# Patient Record
Sex: Female | Born: 1955
Health system: Southern US, Community
[De-identification: ages and names within clinical notes are randomized; demographics above are authoritative.]

## PROBLEM LIST (undated history)

## (undated) DIAGNOSIS — K219 Gastro-esophageal reflux disease without esophagitis: Secondary | ICD-10-CM

## (undated) DIAGNOSIS — M1990 Unspecified osteoarthritis, unspecified site: Secondary | ICD-10-CM

## (undated) DIAGNOSIS — E079 Disorder of thyroid, unspecified: Secondary | ICD-10-CM

## (undated) DIAGNOSIS — F329 Major depressive disorder, single episode, unspecified: Secondary | ICD-10-CM

## (undated) DIAGNOSIS — E538 Deficiency of other specified B group vitamins: Secondary | ICD-10-CM

## (undated) DIAGNOSIS — Z5189 Encounter for other specified aftercare: Secondary | ICD-10-CM

## (undated) DIAGNOSIS — R011 Cardiac murmur, unspecified: Secondary | ICD-10-CM

## (undated) DIAGNOSIS — T7840XA Allergy, unspecified, initial encounter: Secondary | ICD-10-CM

## (undated) DIAGNOSIS — F32A Depression, unspecified: Secondary | ICD-10-CM

## (undated) DIAGNOSIS — F419 Anxiety disorder, unspecified: Secondary | ICD-10-CM

## (undated) HISTORY — DX: Unspecified osteoarthritis, unspecified site: M19.90

## (undated) HISTORY — DX: Disorder of thyroid, unspecified: E07.9

## (undated) HISTORY — DX: Allergy, unspecified, initial encounter: T78.40XA

## (undated) HISTORY — DX: Gastro-esophageal reflux disease without esophagitis: K21.9

## (undated) HISTORY — DX: Encounter for other specified aftercare: Z51.89

## (undated) HISTORY — DX: Depression, unspecified: F32.A

## (undated) HISTORY — DX: Cardiac murmur, unspecified: R01.1

## (undated) HISTORY — DX: Deficiency of other specified B group vitamins: E53.8

## (undated) HISTORY — PX: DILATION AND CURETTAGE OF UTERUS: SHX78

## (undated) HISTORY — DX: Anxiety disorder, unspecified: F41.9

## (undated) HISTORY — PX: ABDOMINAL HYSTERECTOMY: SHX81

## (undated) HISTORY — PX: KNEE SURGERY: SHX244

## (undated) HISTORY — DX: Major depressive disorder, single episode, unspecified: F32.9

---

## 1999-02-28 ENCOUNTER — Other Ambulatory Visit: Admission: RE | Admit: 1999-02-28 | Discharge: 1999-02-28 | Payer: Self-pay | Admitting: Family Medicine

## 2013-10-29 ENCOUNTER — Telehealth: Payer: Self-pay | Admitting: Pulmonary Disease

## 2013-10-29 NOTE — Telephone Encounter (Signed)
Called spoke with pt. I made her aware SN is not accepting any new patients.  She scheduled an appt to see MW in august. nothignf urther needed

## 2013-11-03 ENCOUNTER — Institutional Professional Consult (permissible substitution): Payer: Self-pay | Admitting: Critical Care Medicine

## 2013-12-18 ENCOUNTER — Institutional Professional Consult (permissible substitution): Payer: Self-pay | Admitting: Internal Medicine

## 2014-04-06 ENCOUNTER — Encounter: Payer: Self-pay | Admitting: Vascular Surgery

## 2014-04-06 ENCOUNTER — Other Ambulatory Visit: Payer: Self-pay | Admitting: *Deleted

## 2014-04-06 DIAGNOSIS — I83813 Varicose veins of bilateral lower extremities with pain: Secondary | ICD-10-CM

## 2014-05-20 ENCOUNTER — Encounter: Payer: Self-pay | Admitting: Vascular Surgery

## 2014-05-21 ENCOUNTER — Encounter: Payer: Self-pay | Admitting: Vascular Surgery

## 2014-05-21 ENCOUNTER — Encounter (HOSPITAL_COMMUNITY): Payer: Self-pay

## 2014-07-14 ENCOUNTER — Encounter (HOSPITAL_COMMUNITY): Payer: Self-pay

## 2014-07-14 ENCOUNTER — Encounter: Payer: Self-pay | Admitting: Surgery

## 2014-07-22 ENCOUNTER — Encounter (HOSPITAL_COMMUNITY): Payer: Self-pay

## 2014-07-22 ENCOUNTER — Encounter: Payer: Self-pay | Admitting: Vascular Surgery

## 2020-01-12 ENCOUNTER — Ambulatory Visit: Payer: Self-pay | Admitting: Family

## 2020-02-22 ENCOUNTER — Ambulatory Visit: Payer: Self-pay | Admitting: Family

## 2020-04-22 ENCOUNTER — Other Ambulatory Visit: Payer: Self-pay

## 2020-04-22 ENCOUNTER — Ambulatory Visit
Admission: EM | Admit: 2020-04-22 | Discharge: 2020-04-22 | Disposition: A | Discharge status: Home / Self Care | Payer: PPO | Attending: Family Medicine | Admitting: Family Medicine

## 2020-04-22 DIAGNOSIS — B349 Viral infection, unspecified: Secondary | ICD-10-CM

## 2020-04-22 DIAGNOSIS — Z7689 Persons encountering health services in other specified circumstances: Secondary | ICD-10-CM | POA: Diagnosis not present

## 2020-04-22 MED ORDER — BENZONATATE 100 MG PO CAPS: 100.0000 mg | ORAL_CAPSULE | Freq: Three times a day (TID) | ORAL | 0 refills | Status: DC

## 2020-04-22 NOTE — Discharge Instructions (Addendum)
Concern for some sort of viral illness like Covid versus allergies. You can continue the Mucinex D. Recommend doing Flonase and Zyrtec over-the-counter. Your Covid test is pending Follow up as needed for continued or worsening symptoms

## 2020-04-22 NOTE — ED Provider Notes (Signed)
[facility]    CSN: [id number] Arrival date & time: 04/22/20  1256      History   Chief Complaint Chief Complaint  Patient presents with  . Nasal Congestion  . Cough    HPI Theresa Nichols is a 64 y.o. female.   Patient is a 64 year old female who presents today for cough, nasal congestion started Wednesday.  Symptoms have been constant.  Taking Mucinex with some relief.  She is also had some mild body aches and chills.  No chest pain or shortness of breath     Past Medical History:  Diagnosis Date  . Anxiety   . Depression   . Thyroid disease    hypothyroidism  . Vitamin B12 deficiency     There are no problems to display for this patient.   Past Surgical History:  Procedure Laterality Date  . ABDOMINAL HYSTERECTOMY    . KNEE SURGERY      OB History   No obstetric history on file.      Home Medications    Prior to Admission medications   Medication Sig Start Date End Date Taking? Authorizing Provider  benzonatate (TESSALON) 100 MG capsule Take 1 capsule (100 mg total) by mouth every 8 (eight) hours. 1 to 2 capsules every 8 hours as needed 04/22/20  Yes Solace Manwarren A, NP  Biotin 1 MG CAPS Take 1,000 mg by mouth daily.    [provider]  buPROPion (WELLBUTRIN XL) 150 MG 24 hr tablet Take 150 mg by mouth daily.    [provider]  cholecalciferol (VITAMIN D) 1000 UNITS tablet Take 1,000 Units by mouth daily.    [provider]  clorazepate (TRANXENE) 7.5 MG tablet Take 7.5 mg by mouth 2 (two) times daily as needed for anxiety.    [provider]  Efinaconazole (JUBLIA) 10 % SOLN Apply 10 % topically daily.    [provider]  fluocinonide cream (LIDEX) 0.05 % Apply 1 application topically 2 (two) times daily.    [provider]  levothyroxine (SYNTHROID, LEVOTHROID) 75 MCG tablet Take 75 mcg by mouth daily before breakfast.    [provider]  meloxicam (MOBIC) 15 MG tablet Take  15 mg by mouth daily.    [provider]  Multiple Vitamins-Minerals (CORAL CALCIUM PLUS PO) Take 1,000 mg by mouth daily.    [provider]  Multiple Vitamins-Minerals (MULTIVITAMIN & MINERAL PO) Take by mouth daily.    [provider]  RANITIDINE HCL PO Take 150 mg by mouth 2 (two) times daily.    [provider]  valACYclovir (VALTREX) 1000 MG tablet Take 1,000 mg by mouth 2 (two) times daily.    [provider]    Family History Family History  Family history unknown: Yes    Social History Social History   Tobacco Use  . Smoking status: Former Games developer  . Smokeless tobacco: Never Used  Substance Use Topics  . Alcohol use: Not Currently  . Drug use: Never     Allergies   Patient has no known allergies.   Review of Systems Review of Systems   Physical Exam Triage Vital Signs ED Triage Vitals [04/22/20 1328]  Enc Vitals Group     BP (!) 143/82     Pulse Rate 75     Resp 18     Temp 98.6 F (37 C)     Temp src      SpO2 98 %  Weight      Height      Head Circumference      Peak Flow      Pain Score      Pain Loc      Pain Edu?      Excl. in [place]?    No data found.  Updated Vital Signs BP (!) 143/82   Pulse 75   Temp 98.6 F (37 C)   Resp 18   SpO2 98%   Visual Acuity Right Eye Distance:   Left Eye Distance:   Bilateral Distance:    Right Eye Near:   Left Eye Near:    Bilateral Near:     Physical Exam Vitals and nursing note reviewed.  Constitutional:      General: She is not in acute distress.    Appearance: Normal appearance. She is not ill-appearing, toxic-appearing or diaphoretic.  HENT:     Head: Normocephalic.     Right Ear: Tympanic membrane and ear canal normal.     Left Ear: Tympanic membrane and ear canal normal.     Nose: Congestion present.     Mouth/Throat:     Pharynx: Oropharynx is clear.  Eyes:     Conjunctiva/sclera: Conjunctivae normal.  Cardiovascular:     Rate and  Rhythm: Normal rate and regular rhythm.  Pulmonary:     Effort: Pulmonary effort is normal.     Breath sounds: Normal breath sounds.  Musculoskeletal:        General: Normal range of motion.     Cervical back: Normal range of motion.  Skin:    General: Skin is warm and dry.     Findings: No rash.  Neurological:     Mental Status: She is alert.  Psychiatric:        Mood and Affect: Mood normal.      UC Treatments / Results  Labs (all labs ordered are listed, but only abnormal results are displayed) Labs Reviewed  COVID-19, FLU A+B NAA    EKG   Radiology No results found.  Procedures Procedures (including critical care time)  Medications Ordered in UC Medications - No data to display  Initial Impression / Assessment and Plan / UC Course  I have reviewed the triage vital signs and the nursing notes.  Pertinent labs & imaging results that were available during my care of the patient were reviewed by me and considered in my medical decision making (see chart for details).     Viral illness Concern for Covid based on symptoms.  Covid swab pending.  Mucinex D as needed.  Zyrtec and Flonase over-the-counter.  Tessalon Perles for cough. Follow up as needed for continued or worsening symptoms  Final Clinical Impressions(s) / UC Diagnoses   Final diagnoses:  Viral illness     Discharge Instructions     Concern for some sort of viral illness like Covid versus allergies. You can continue the Mucinex D. Recommend doing Flonase and Zyrtec over-the-counter. Your Covid test is pending Follow up as needed for continued or worsening symptoms     ED Prescriptions    Medication Sig Dispense Auth. Provider   benzonatate (TESSALON) 100 MG capsule Take 1 capsule (100 mg total) by mouth every 8 (eight) hours. 1 to 2 capsules every 8 hours as needed 45 capsule Eiden Bagot A, NP     PDMP not reviewed this encounter.   Orvan July, NP 04/22/20 1425

## 2020-04-22 NOTE — ED Triage Notes (Signed)
Pt presents with cough and nasal congestion that began on wednesday

## 2020-04-27 LAB — COVID-19, FLU A+B NAA
Influenza A, NAA: NOT DETECTED
Influenza B, NAA: NOT DETECTED
SARS-CoV-2, NAA: DETECTED — AB

## 2020-05-18 ENCOUNTER — Encounter: Payer: Self-pay | Admitting: Family

## 2020-05-18 ENCOUNTER — Other Ambulatory Visit: Payer: Self-pay

## 2020-05-18 ENCOUNTER — Other Ambulatory Visit: Payer: Self-pay | Admitting: Family

## 2020-05-18 ENCOUNTER — Ambulatory Visit (INDEPENDENT_AMBULATORY_CARE_PROVIDER_SITE_OTHER): Payer: PPO | Admitting: Family

## 2020-05-18 VITALS — BP 140/82 | HR 87 | Temp 98.3°F | Ht 62.5 in | Wt 159.0 lb

## 2020-05-18 DIAGNOSIS — L989 Disorder of the skin and subcutaneous tissue, unspecified: Secondary | ICD-10-CM | POA: Diagnosis not present

## 2020-05-18 DIAGNOSIS — E559 Vitamin D deficiency, unspecified: Secondary | ICD-10-CM | POA: Diagnosis not present

## 2020-05-18 DIAGNOSIS — E039 Hypothyroidism, unspecified: Secondary | ICD-10-CM

## 2020-05-18 DIAGNOSIS — Z13220 Encounter for screening for lipoid disorders: Secondary | ICD-10-CM

## 2020-05-18 DIAGNOSIS — R5383 Other fatigue: Secondary | ICD-10-CM

## 2020-05-18 DIAGNOSIS — Z1231 Encounter for screening mammogram for malignant neoplasm of breast: Secondary | ICD-10-CM | POA: Diagnosis not present

## 2020-05-18 DIAGNOSIS — E538 Deficiency of other specified B group vitamins: Secondary | ICD-10-CM | POA: Diagnosis not present

## 2020-05-18 LAB — CBC WITH DIFFERENTIAL/PLATELET
Basophils Absolute: 0 10*3/uL (ref 0.0–0.1)
Basophils Relative: 0.7 % (ref 0.0–3.0)
Eosinophils Absolute: 0.2 10*3/uL (ref 0.0–0.7)
Eosinophils Relative: 2.9 % (ref 0.0–5.0)
HCT: 40.5 % (ref 36.0–46.0)
Hemoglobin: 13.5 g/dL (ref 12.0–15.0)
Lymphocytes Relative: 29.1 % (ref 12.0–46.0)
Lymphs Abs: 1.6 10*3/uL (ref 0.7–4.0)
MCHC: 33.4 g/dL (ref 30.0–36.0)
MCV: 89 fl (ref 78.0–100.0)
Monocytes Absolute: 0.5 10*3/uL (ref 0.1–1.0)
Monocytes Relative: 9 % (ref 3.0–12.0)
Neutro Abs: 3.2 10*3/uL (ref 1.4–7.7)
Neutrophils Relative %: 58.3 % (ref 43.0–77.0)
Platelets: 341 10*3/uL (ref 150.0–400.0)
RBC: 4.55 Mil/uL (ref 3.87–5.11)
RDW: 14.4 % (ref 11.5–15.5)
WBC: 5.4 10*3/uL (ref 4.0–10.5)

## 2020-05-18 LAB — VITAMIN D 25 HYDROXY (VIT D DEFICIENCY, FRACTURES): VITD: 25.82 ng/mL — ABNORMAL LOW (ref 30.00–100.00)

## 2020-05-18 LAB — LIPID PANEL
Cholesterol: 237 mg/dL — ABNORMAL HIGH (ref 0–200)
HDL: 61.8 mg/dL (ref >39.00)
LDL Cholesterol: 158 mg/dL — ABNORMAL HIGH (ref 0–99)
NonHDL: 174.76
Total CHOL/HDL Ratio: 4
Triglycerides: 83 mg/dL (ref 0.0–149.0)
VLDL: 16.6 mg/dL (ref 0.0–40.0)

## 2020-05-18 LAB — COMPREHENSIVE METABOLIC PANEL
ALT: 15 U/L (ref 0–35)
AST: 20 U/L (ref 0–37)
Albumin: 4.1 g/dL (ref 3.5–5.2)
Alkaline Phosphatase: 74 U/L (ref 39–117)
BUN: 17 mg/dL (ref 6–23)
CO2: 29 mEq/L (ref 19–32)
Calcium: 9.6 mg/dL (ref 8.4–10.5)
Chloride: 104 mEq/L (ref 96–112)
Creatinine, Ser: 0.85 mg/dL (ref 0.40–1.20)
GFR: 73 mL/min (ref >60.00)
Glucose, Bld: 85 mg/dL (ref 70–99)
Potassium: 4 mEq/L (ref 3.5–5.1)
Sodium: 139 mEq/L (ref 135–145)
Total Bilirubin: 0.9 mg/dL (ref 0.2–1.2)
Total Protein: 6.8 g/dL (ref 6.0–8.3)

## 2020-05-18 LAB — TSH: TSH: 7.42 u[IU]/mL — ABNORMAL HIGH (ref 0.35–4.50)

## 2020-05-18 LAB — VITAMIN B12: Vitamin B-12: 445 pg/mL (ref 211–911)

## 2020-05-18 MED ORDER — LEVOTHYROXINE SODIUM 75 MCG PO TABS: 75.0000 ug | ORAL_TABLET | Freq: Every day | ORAL | 1 refills | Status: DC

## 2020-05-18 MED ORDER — VALACYCLOVIR HCL 1 G PO TABS: 1000.0000 mg | ORAL_TABLET | Freq: Two times a day (BID) | ORAL | 1 refills | Status: DC

## 2020-05-18 MED ORDER — DICLOFENAC SODIUM 1 % EX GEL: 4.0000 g | Freq: Four times a day (QID) | CUTANEOUS | 0 refills | Status: DC

## 2020-05-18 NOTE — Progress Notes (Signed)
Theresa Nichols is a 65 y.o. female with the following history as recorded in EpicCare:  There are no problems to display for this patient.   Current Outpatient Medications  Medication Sig Dispense Refill  . Biotin 1 MG CAPS Take 1,000 mg by mouth daily.    . cholecalciferol (VITAMIN D) 1000 UNITS tablet Take 1,000 Units by mouth daily.    . diclofenac Sodium (VOLTAREN) 1 % GEL Apply 4 g topically 4 (four) times daily. 150 g 0  . fluocinonide cream (LIDEX) 1.61 % Apply 1 application topically 2 (two) times daily.    Marland Kitchen levothyroxine (SYNTHROID, LEVOTHROID) 75 MCG tablet Take 75 mcg by mouth daily before breakfast.    . Multiple Vitamins-Minerals (MULTIVITAMIN & MINERAL PO) Take by mouth daily.    . valACYclovir (VALTREX) 1000 MG tablet Take 1 tablet (1,000 mg total) by mouth 2 (two) times daily. 30 tablet 1   No current facility-administered medications for this visit.    Allergies: Patient has no known allergies.  Past Medical History:  Diagnosis Date  . Anxiety   . Depression   . Thyroid disease    hypothyroidism  . Vitamin B12 deficiency     Past Surgical History:  Procedure Laterality Date  . ABDOMINAL HYSTERECTOMY    . DILATION AND CURETTAGE OF UTERUS    . KNEE SURGERY      Family History  Family history unknown: Yes    Social History   Tobacco Use  . Smoking status: Former Research scientist (life sciences)  . Smokeless tobacco: Never Used  Substance Use Topics  . Alcohol use: Not Currently    Subjective:  Patient presents today as a new patient; accompanied by her husband; Notes that she had a "bad fall" in 2019- asking to see a neurologist due to persisting headaches; wants to see provider at [facility];  Also complaining of chronic right knee pain- wants to see provider at [facility];    Needs to get labs updated; has history of hypothyroidism- admits she is not taking her medication regularly; has not had insurance for past few years;     Objective:  Vitals:   05/18/20 0909  BP: 140/82   Pulse: 87  Temp: 98.3 F (36.8 C)  TempSrc: Oral  SpO2: 98%  Weight: 159 lb (72.1 kg)  Height: 5' 2.5" (1.588 m)    General: Well developed, well nourished, in no acute distress  Skin : Warm and dry.  Head: Normocephalic and atraumatic  Eyes: Sclera and conjunctiva clear; pupils round and reactive to light; extraocular movements intact  Ears: External normal; canals clear; tympanic membranes normal  Oropharynx: Pink, supple. No suspicious lesions  Neck: Supple without thyromegaly, adenopathy  Lungs: Respirations unlabored; clear to auscultation bilaterally without wheeze, rales, rhonchi  CVS exam: normal rate and regular rhythm.  Neurologic: Alert and oriented; speech intact; face symmetrical; moves all extremities well; CNII-XII intact without focal deficit   Assessment:  1. Screening mammogram for breast cancer   2. Skin abnormalities   3. Other fatigue   4. Lipid screening   5. Hypothyroidism, unspecified type   6. Vitamin D deficiency   7. Low vitamin B12 level     Plan:  1. Referral to [facility] for mammogram; 2. Refer to dermatology per patient request; Update labs today- stressed need to take her thyroid medication daily as prescribed.  Patient wants to call back with names of providers she wants to see at [facility] for neurology and orthopedics;  She also mentions that she  wants as much of her care done in [place] as possible and will consider seeing [facility] in [place] as this is closer to her home.  Follow up to be determined;   No follow-ups on file.  Orders Placed This Encounter  Procedures  . MM Digital Screening    Standing Status:   Future    Standing Expiration Date:   05/18/2021    Order Specific Question:   Reason for Exam (SYMPTOM  OR DIAGNOSIS REQUIRED)    Answer:   screening mammogram    Order Specific Question:   Preferred imaging location?    Answer:   [facility]  . CBC with Differential/Platelet    Standing Status:    Future    Number of Occurrences:   1    Standing Expiration Date:   05/18/2021  . Comp Met (CMET)    Standing Status:   Future    Number of Occurrences:   1    Standing Expiration Date:   05/18/2021  . Lipid panel    Standing Status:   Future    Number of Occurrences:   1    Standing Expiration Date:   05/18/2021  . TSH    Standing Status:   Future    Number of Occurrences:   1    Standing Expiration Date:   05/18/2021  . Vitamin B12    Standing Status:   Future    Number of Occurrences:   1    Standing Expiration Date:   05/18/2021  . Vitamin D (25 hydroxy)    Standing Status:   Future    Number of Occurrences:   1    Standing Expiration Date:   05/18/2021  . Thyroid Panel With TSH  . Ambulatory referral to Dermatology    Referral Priority:   Routine    Referral Type:   Consultation    Referral Reason:   Specialty Services Required    Requested Specialty:   Dermatology    Number of Visits Requested:   1    Requested Prescriptions   Signed Prescriptions Disp Refills  . valACYclovir (VALTREX) 1000 MG tablet 30 tablet 1    Sig: Take 1 tablet (1,000 mg total) by mouth 2 (two) times daily.  . diclofenac Sodium (VOLTAREN) 1 % GEL 150 g 0    Sig: Apply 4 g topically 4 (four) times daily.

## 2020-05-19 LAB — THYROID PANEL WITH TSH
Free Thyroxine Index: 2.2 (ref 1.4–3.8)
T3 Uptake: 28 % (ref 22–35)
T4, Total: 8 ug/dL (ref 5.1–11.9)
TSH: 6.66 mIU/L — ABNORMAL HIGH (ref 0.40–4.50)

## 2020-05-30 ENCOUNTER — Ambulatory Visit (HOSPITAL_COMMUNITY): Payer: PPO

## 2020-05-31 ENCOUNTER — Encounter: Payer: Self-pay | Admitting: Family

## 2020-06-01 ENCOUNTER — Other Ambulatory Visit: Payer: Self-pay | Admitting: Family

## 2020-06-01 DIAGNOSIS — E039 Hypothyroidism, unspecified: Secondary | ICD-10-CM

## 2020-06-01 DIAGNOSIS — Z87828 Personal history of other (healed) physical injury and trauma: Secondary | ICD-10-CM

## 2020-06-06 ENCOUNTER — Telehealth: Payer: Self-pay | Admitting: Family

## 2020-06-06 DIAGNOSIS — E2839 Other primary ovarian failure: Secondary | ICD-10-CM

## 2020-06-06 NOTE — Telephone Encounter (Signed)
I am not sure if she has responded to the message from [facility];  They have been trying to reach her to let her know that she has an appointment there on 2/17 at 10 am with provider, Schuyler Amor;  She needs to call them at [phone] with continued concerns or to re-schedule.

## 2020-06-07 NOTE — Telephone Encounter (Signed)
LVM re: laura's note to contact [facility] if needed to reschedule appt.

## 2020-06-08 ENCOUNTER — Other Ambulatory Visit: Payer: Self-pay

## 2020-06-08 ENCOUNTER — Ambulatory Visit (HOSPITAL_COMMUNITY)
Admission: RE | Admit: 2020-06-08 | Discharge: 2020-06-08 | Disposition: A | Discharge status: Home / Self Care | Payer: PPO | Source: Ambulatory Visit | Attending: Family | Admitting: Family

## 2020-06-08 ENCOUNTER — Encounter (HOSPITAL_COMMUNITY): Payer: Self-pay

## 2020-06-08 DIAGNOSIS — Z1231 Encounter for screening mammogram for malignant neoplasm of breast: Secondary | ICD-10-CM | POA: Diagnosis not present

## 2020-06-08 NOTE — Telephone Encounter (Signed)
Pt states she has confirmed appt tomorrow with Donnetta Simpers.  She would like to know if a bone density test is recommended.

## 2020-06-10 DIAGNOSIS — D1801 Hemangioma of skin and subcutaneous tissue: Secondary | ICD-10-CM | POA: Diagnosis not present

## 2020-06-10 DIAGNOSIS — L820 Inflamed seborrheic keratosis: Secondary | ICD-10-CM | POA: Diagnosis not present

## 2020-06-10 NOTE — Telephone Encounter (Signed)
When was the last time she had a bone density? She didn't bring any records to review at her first OV. Has her orthopedist ordered for her- it looks like she has been working with someone in [place].

## 2020-06-15 ENCOUNTER — Encounter: Payer: Self-pay | Admitting: Family

## 2020-06-17 ENCOUNTER — Other Ambulatory Visit: Payer: Self-pay | Admitting: Family

## 2020-06-17 DIAGNOSIS — E559 Vitamin D deficiency, unspecified: Secondary | ICD-10-CM

## 2020-06-17 NOTE — Telephone Encounter (Signed)
LVM instructing pt to call back for Mickel Baas response.  Will send mychart message

## 2020-06-21 NOTE — Addendum Note (Signed)
Addended by: Sherlene Shams on: 06/21/2020 12:39 PM   Modules accepted: Orders

## 2020-06-21 NOTE — Telephone Encounter (Signed)
DEXA has been ordered at [facility] for her;

## 2020-06-22 NOTE — Telephone Encounter (Signed)
Appt scheduled at [facility] on 06/29/20. Pt aware.

## 2020-06-29 ENCOUNTER — Other Ambulatory Visit (INDEPENDENT_AMBULATORY_CARE_PROVIDER_SITE_OTHER): Payer: PPO

## 2020-06-29 ENCOUNTER — Other Ambulatory Visit: Payer: Self-pay

## 2020-06-29 ENCOUNTER — Ambulatory Visit (INDEPENDENT_AMBULATORY_CARE_PROVIDER_SITE_OTHER)
Admission: RE | Admit: 2020-06-29 | Discharge: 2020-06-29 | Disposition: A | Discharge status: Home / Self Care | Payer: PPO | Source: Ambulatory Visit | Attending: Family | Admitting: Family

## 2020-06-29 DIAGNOSIS — E2839 Other primary ovarian failure: Secondary | ICD-10-CM | POA: Diagnosis not present

## 2020-06-29 DIAGNOSIS — E559 Vitamin D deficiency, unspecified: Secondary | ICD-10-CM

## 2020-06-29 DIAGNOSIS — E039 Hypothyroidism, unspecified: Secondary | ICD-10-CM | POA: Diagnosis not present

## 2020-06-29 LAB — VITAMIN D 25 HYDROXY (VIT D DEFICIENCY, FRACTURES): VITD: 33.02 ng/mL (ref 30.00–100.00)

## 2020-06-30 ENCOUNTER — Other Ambulatory Visit: Payer: Self-pay | Admitting: Family

## 2020-06-30 LAB — THYROID PANEL WITH TSH
Free Thyroxine Index: 2.9 (ref 1.4–3.8)
T3 Uptake: 28 % (ref 22–35)
T4, Total: 10.5 ug/dL (ref 5.1–11.9)
TSH: 1.69 mIU/L (ref 0.40–4.50)

## 2020-06-30 MED ORDER — LEVOTHYROXINE SODIUM 75 MCG PO TABS: 75.0000 ug | ORAL_TABLET | Freq: Every day | ORAL | 2 refills | Status: DC

## 2020-07-16 ENCOUNTER — Other Ambulatory Visit: Payer: Self-pay | Admitting: Family

## 2020-08-05 ENCOUNTER — Ambulatory Visit: Payer: PPO | Admitting: Neurology

## 2020-08-22 ENCOUNTER — Encounter: Payer: Self-pay | Admitting: Family

## 2020-08-22 ENCOUNTER — Other Ambulatory Visit: Payer: Self-pay | Admitting: Family

## 2020-08-22 MED ORDER — CETIRIZINE HCL 10 MG PO TABS: 10.0000 mg | ORAL_TABLET | Freq: Every day | ORAL | 2 refills | Status: DC

## 2020-08-22 MED ORDER — FLUTICASONE PROPIONATE 50 MCG/ACT NA SUSP: 2.0000 | Freq: Every day | NASAL | 2 refills | Status: DC

## 2020-08-22 NOTE — Telephone Encounter (Signed)
I have attempted to call pt to ask her if she was going to continue care with [place] at [facility] or go to [place]. There was no answer and the mailbox was full.   Also please advise on med request.

## 2020-09-27 ENCOUNTER — Ambulatory Visit (INDEPENDENT_AMBULATORY_CARE_PROVIDER_SITE_OTHER): Payer: PPO | Admitting: Neurology

## 2020-09-27 ENCOUNTER — Encounter: Payer: Self-pay | Admitting: Neurology

## 2020-09-27 DIAGNOSIS — G43019 Migraine without aura, intractable, without status migrainosus: Secondary | ICD-10-CM | POA: Diagnosis not present

## 2020-09-27 DIAGNOSIS — Z87820 Personal history of traumatic brain injury: Secondary | ICD-10-CM | POA: Diagnosis not present

## 2020-09-27 HISTORY — DX: Migraine without aura, intractable, without status migrainosus: G43.019

## 2020-09-27 HISTORY — DX: Personal history of traumatic brain injury: Z87.820

## 2020-09-27 MED ORDER — TOPIRAMATE 25 MG PO TABS: ORAL_TABLET | ORAL | 3 refills | Status: DC

## 2020-09-27 MED ORDER — RIZATRIPTAN BENZOATE 10 MG PO TABS: 10.0000 mg | ORAL_TABLET | Freq: Three times a day (TID) | ORAL | 3 refills | Status: DC | PRN

## 2020-09-27 NOTE — Progress Notes (Signed)
Reason for visit: Headache  Referring physician: Dr. Adelene Idler is a 65 y.o. female  History of present illness:  Theresa Nichols is a 65 year old left-handed white female with a history of frequent headache.  The patient indicates that she did not have any problems with headaches until she fell going out to her car after work in March 2019.  The patient slipped on gravel, she fell and hit the left frontotemporal area of her head.  She is not clear whether or not she lost consciousness, but she fractured a tooth with the fall.  Since that time, she has had very frequent headaches.  The headaches have been associated with photophobia and phonophobia, she has had some dizziness and vertigo and difficulty with balance.  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  She also injured her right shoulder and right knee that required surgery as well.  The patient has had some improvement in her headaches as time has gone on, but she still has 15-20 headache days a month.  The headaches are in the right frontotemporal area, when they are severe they are associated with photophobia and phonophobia and some nausea and vomiting.  The patient may have some cognitive changes and word finding problems.  Bright lights will bring on a headache.  She takes Tylenol and ibuprofen for the headache and sometimes uses ice and gets into a dark room.  The headache may last all day long.  She has never been on any daily prophylactic medication.  The patient did have a CT scan of the brain done at [facility].  The report of the study is not available to me.  The patient also reports icepick pains in the right frontotemporal area that may last 30 seconds to 2 minutes independent of her migraine headache.  She is sent to this office for further evaluation.  Past Medical History:  Diagnosis Date  . Anxiety   . Depression   . Thyroid disease    hypothyroidism  . Vitamin B12  deficiency     Past Surgical History:  Procedure Laterality Date  . ABDOMINAL HYSTERECTOMY    . DILATION AND CURETTAGE OF UTERUS    . KNEE SURGERY      Family History  Problem Relation Age of Onset  . Breast cancer Cousin     Social history:  reports that she has quit smoking. She has never used smokeless tobacco. She reports previous alcohol use. She reports that she does not use drugs.  Medications:  Prior to Admission medications   Medication Sig Start Date End Date Taking? Authorizing Provider  Biotin 1 MG CAPS Take 1,000 mg by mouth daily.   Yes [provider]  cetirizine (ZYRTEC) 10 MG tablet Take 1 tablet (10 mg total) by mouth daily. 08/22/20  Yes Marrian Salvage, FNP  cholecalciferol (VITAMIN D) 1000 UNITS tablet Take 1,000 Units by mouth daily.   Yes [provider]  diclofenac Sodium (VOLTAREN) 1 % GEL APPLY 4 GRAMS TOPICALLY 4 TIMES DAILY AS DIRECTED 07/18/20  Yes Marrian Salvage, FNP  fluocinonide cream (LIDEX) 0.62 % Apply 1 application topically 2 (two) times daily.   Yes [provider]  fluticasone (FLONASE) 50 MCG/ACT nasal spray Place 2 sprays into both nostrils daily. 08/22/20  Yes Marrian Salvage, FNP  levothyroxine (SYNTHROID) 75 MCG tablet Take 1 tablet (75 mcg total) by mouth daily before breakfast. 06/30/20  Yes Marrian Salvage, FNP  Multiple Vitamins-Minerals (MULTIVITAMIN & MINERAL PO) Take by mouth daily.   Yes [provider]  valACYclovir (VALTREX) 1000 MG tablet Take 1 tablet (1,000 mg total) by mouth 2 (two) times daily. 05/18/20  Yes Marrian Salvage, FNP     No Known Allergies  ROS:  Out of a complete 14 system review of symptoms, the patient complains only of the following symptoms, and all other reviewed systems are negative.  Headache Dizziness Joint pain  Blood pressure 134/82, pulse 72, height 5\' 2"  (1.575 m), weight 159 lb 9.6 oz (72.4 kg).  Physical Exam  General: The  patient is alert and cooperative at the time of the examination.  Eyes: Pupils are equal, round, and reactive to light. Discs are flat bilaterally.  Neck: The neck is supple, no carotid bruits are noted.  Respiratory: The respiratory examination is clear.  Cardiovascular: The cardiovascular examination reveals a regular rate and rhythm, no obvious murmurs or rubs are noted.  Skin: Extremities are without significant edema.  Neurologic Exam  Mental status: The patient is alert and oriented x 3 at the time of the examination. The patient has apparent normal recent and remote memory, with an apparently normal attention span and concentration ability.  Cranial nerves: Facial symmetry is present. There is good sensation of the face to pinprick and soft touch bilaterally. The strength of the facial muscles and the muscles to head turning and shoulder shrug are normal bilaterally. Speech is well enunciated, no aphasia or dysarthria is noted. Extraocular movements are full. Visual fields are full. The tongue is midline, and the patient has symmetric elevation of the soft palate. No obvious hearing deficits are noted.  Motor: The motor testing reveals 5 over 5 strength of all 4 extremities. Good symmetric motor tone is noted throughout.  Sensory: Sensory testing is intact to pinprick, soft touch, vibration sensation, and position sense on all 4 extremities. No evidence of extinction is noted.  Coordination: Cerebellar testing reveals good finger-nose-finger and heel-to-shin bilaterally.  Gait and station: Gait is associated with a limping gait on the right leg. Tandem gait is slightly unsteady. Romberg is negative. No drift is seen.  Reflexes: Deep tendon reflexes are symmetric and normal bilaterally. Toes are downgoing bilaterally.   Assessment/Plan:  1.  History of concussion  2.  Trauma triggered migraine  The patient is having frequent headaches that are consistent with migraine type  events at this point.  The patient will be started on Topamax working up to 75 mg daily, she will call for any dose adjustments.  The patient will be given Maxalt to take if needed for the headache.  She will follow-up here in 4 months.  We will try to get the report of the CT scan of the brain done previously at [place] [facility] 09/27/2020 10:15 AM  [facility] [address] [place], [place] [address]  Phone [phone] Fax [phone]

## 2020-09-27 NOTE — Patient Instructions (Signed)
We will start Topamax for headache prevention. Take Maxalt if needed for the headache.  Topamax (topiramate) is a seizure medication that has an FDA approval for seizures and for migraine headache. Potential side effects of this medication include weight loss, cognitive slowing, tingling in the fingers and toes, and carbonated drinks will taste bad. If any significant side effects are noted on this drug, please contact our office.

## 2020-09-29 ENCOUNTER — Telehealth: Payer: Self-pay | Admitting: Neurology

## 2020-09-29 NOTE — Telephone Encounter (Signed)
I have the report of the CT scan of the brain done at [facility] on 28 June 2017.  This is following a fall.  CT of the head and cervical spine were done.   CT head and cervical 06/28/17:  Impression:  1.  Negative head CT.  No intracranial mass, hemorrhage, or edema.  No skull fracture. 2.  No facial bone fracture or dislocation. 3.  No fracture or acute subluxation within the cervical spine.  Mild degenerative changes within the lower cervical spine.   CT of the head did note some calcified atherosclerotic changes of the large vessels at the base of the skull.  No abnormalities of the brain parenchyma was seen.

## 2020-11-30 ENCOUNTER — Encounter: Payer: Self-pay | Admitting: Family

## 2020-11-30 NOTE — Telephone Encounter (Signed)
Please advise on med refill request until she is seen by Dr. Ronnald Ramp.

## 2020-12-02 NOTE — Telephone Encounter (Signed)
I have called pt and there was no answer so I left a message to call back.

## 2020-12-06 NOTE — Telephone Encounter (Signed)
Attempted to call pt once again no answer.

## 2021-02-08 ENCOUNTER — Ambulatory Visit: Payer: PPO | Admitting: Adult Health

## 2021-03-01 ENCOUNTER — Other Ambulatory Visit: Payer: Self-pay | Admitting: Family

## 2021-03-15 ENCOUNTER — Other Ambulatory Visit: Payer: Self-pay

## 2021-03-15 ENCOUNTER — Encounter: Payer: Self-pay | Admitting: Internal Medicine

## 2021-03-15 ENCOUNTER — Ambulatory Visit (INDEPENDENT_AMBULATORY_CARE_PROVIDER_SITE_OTHER): Payer: 59 | Admitting: Internal Medicine

## 2021-03-15 VITALS — BP 126/84 | HR 74 | Temp 97.7°F | Ht 62.0 in | Wt 162.0 lb

## 2021-03-15 DIAGNOSIS — M7918 Myalgia, other site: Secondary | ICD-10-CM

## 2021-03-15 DIAGNOSIS — E039 Hypothyroidism, unspecified: Secondary | ICD-10-CM

## 2021-03-15 DIAGNOSIS — Z0001 Encounter for general adult medical examination with abnormal findings: Secondary | ICD-10-CM | POA: Diagnosis not present

## 2021-03-15 DIAGNOSIS — G2581 Restless legs syndrome: Secondary | ICD-10-CM | POA: Insufficient documentation

## 2021-03-15 DIAGNOSIS — I7300 Raynaud's syndrome without gangrene: Secondary | ICD-10-CM

## 2021-03-15 DIAGNOSIS — G8929 Other chronic pain: Secondary | ICD-10-CM

## 2021-03-15 DIAGNOSIS — Z1211 Encounter for screening for malignant neoplasm of colon: Secondary | ICD-10-CM

## 2021-03-15 LAB — IBC + FERRITIN
Ferritin: 22.2 ng/mL (ref 10.0–291.0)
Iron: 68 ug/dL (ref 42–145)
Saturation Ratios: 20.8 % (ref 20.0–50.0)
TIBC: 327.6 ug/dL (ref 250.0–450.0)
Transferrin: 234 mg/dL (ref 212.0–360.0)

## 2021-03-15 LAB — TSH: TSH: 3.92 u[IU]/mL (ref 0.35–5.50)

## 2021-03-15 LAB — CBC WITH DIFFERENTIAL/PLATELET
Basophils Absolute: 0.1 10*3/uL (ref 0.0–0.1)
Basophils Relative: 0.9 % (ref 0.0–3.0)
Eosinophils Absolute: 0.1 10*3/uL (ref 0.0–0.7)
Eosinophils Relative: 2.4 % (ref 0.0–5.0)
HCT: 39.8 % (ref 36.0–46.0)
Hemoglobin: 13.2 g/dL (ref 12.0–15.0)
Lymphocytes Relative: 26.9 % (ref 12.0–46.0)
Lymphs Abs: 1.6 10*3/uL (ref 0.7–4.0)
MCHC: 33.1 g/dL (ref 30.0–36.0)
MCV: 89.2 fl (ref 78.0–100.0)
Monocytes Absolute: 0.5 10*3/uL (ref 0.1–1.0)
Monocytes Relative: 8 % (ref 3.0–12.0)
Neutro Abs: 3.6 10*3/uL (ref 1.4–7.7)
Neutrophils Relative %: 61.8 % (ref 43.0–77.0)
Platelets: 331 10*3/uL (ref 150.0–400.0)
RBC: 4.46 Mil/uL (ref 3.87–5.11)
RDW: 14.6 % (ref 11.5–15.5)
WBC: 5.8 10*3/uL (ref 4.0–10.5)

## 2021-03-15 LAB — C-REACTIVE PROTEIN: CRP: <1 mg/dL (ref 0.5–20.0)

## 2021-03-15 NOTE — Patient Instructions (Signed)

## 2021-03-15 NOTE — Progress Notes (Signed)
Subjective:  Patient ID: Theresa Nichols, female    DOB: 29-Nov-1955  Age: 65 y.o. MRN: [id number]  CC: Annual Exam  This visit occurred during the SARS-CoV-2 public health emergency.  Safety protocols were in place, including screening questions prior to the visit, additional usage of staff PPE, and extensive cleaning of exam room while observing appropriate contact time as indicated for disinfecting solutions.    HPI YAZMINE SOREY presents for a CPX and to establish. She fell 3 years ago and sustained multiple injuries.  She tells me she is 90% better but continues to complain of BLE MSK pain. She wants a refill of flexeril.  She also complains of snoring, insomnia, and leg movements at night.  She tells me that no one complains about her nocturnal leg movements.  She also complains of a 1 year history of painless changes in the colors in her fingers.  She has photos of her fingers and stages of red, blue, and white.  This has been exacerbated over the last 2 months with colder weather.  She denies arthralgias or rash.  Outpatient Medications Prior to Visit  Medication Sig Dispense Refill   Biotin 1 MG CAPS Take 1,000 mg by mouth daily.     cetirizine (ZYRTEC) 10 MG tablet Take 1 tablet (10 mg total) by mouth daily. 30 tablet 2   cholecalciferol (VITAMIN D) 1000 UNITS tablet Take 1,000 Units by mouth daily.     diclofenac Sodium (VOLTAREN) 1 % GEL APPLY 4 GRAMS TOPICALLY 4 TIMES DAILY AS DIRECTED 150 g 0   fluocinonide cream (LIDEX) 4.00 % Apply 1 application topically 2 (two) times daily.     fluticasone (FLONASE) 50 MCG/ACT nasal spray Place 2 sprays into both nostrils daily. 16 g 2   levothyroxine (SYNTHROID) 75 MCG tablet Take 1 tablet (75 mcg total) by mouth daily before breakfast. 90 tablet 2   Multiple Vitamins-Minerals (MULTIVITAMIN & MINERAL PO) Take by mouth daily.     rizatriptan (MAXALT) 10 MG tablet Take 1 tablet (10 mg total) by mouth 3 (three) times daily as needed  for migraine. May repeat in 2 hours if needed 10 tablet 3   topiramate (TOPAMAX) 25 MG tablet Take one tablet at night for one week, then take 2 tablets at night for one week, then take 3 tablets at night. 90 tablet 3   valACYclovir (VALTREX) 1000 MG tablet Take 1 tablet (1,000 mg total) by mouth 2 (two) times daily. 30 tablet 1   No facility-administered medications prior to visit.    ROS Review of Systems  Constitutional:  Negative for diaphoresis and fatigue.  HENT: Negative.    Eyes: Negative.   Respiratory:  Negative for apnea, cough, chest tightness, shortness of breath and wheezing.   Cardiovascular:  Negative for chest pain, palpitations and leg swelling.  Gastrointestinal:  Negative for abdominal pain, constipation, diarrhea, nausea and vomiting.  Endocrine: Negative.   Genitourinary: Negative.  Negative for difficulty urinating.  Musculoskeletal:  Positive for myalgias. Negative for arthralgias and back pain.  Skin:  Positive for color change.  Neurological: Negative.  Negative for dizziness, weakness, light-headedness and headaches.  Hematological:  Negative for adenopathy. Does not bruise/bleed easily.  Psychiatric/Behavioral:  Positive for sleep disturbance. Negative for dysphoric mood. The patient is not nervous/anxious.    Objective:  BP 126/84 (BP Location: Left Arm, Patient Position: Sitting, Cuff Size: Large)   Pulse 74   Temp 97.7 F (36.5 C) (Oral)   Ht 5'  2" (1.575 m)   Wt 162 lb (73.5 kg)   SpO2 99%   BMI 29.63 kg/m   BP Readings from Last 3 Encounters:  03/15/21 126/84  09/27/20 134/82  05/18/20 140/82    Wt Readings from Last 3 Encounters:  03/15/21 162 lb (73.5 kg)  09/27/20 159 lb 9.6 oz (72.4 kg)  05/18/20 159 lb (72.1 kg)    Physical Exam Vitals reviewed.  Constitutional:      Appearance: Normal appearance.  HENT:     Nose: Nose normal.     Mouth/Throat:     Mouth: Mucous membranes are moist.  Eyes:     General: No scleral icterus.     Conjunctiva/sclera: Conjunctivae normal.  Cardiovascular:     Rate and Rhythm: Normal rate and regular rhythm.     Heart sounds: No murmur heard. Pulmonary:     Effort: Pulmonary effort is normal.     Breath sounds: No stridor. No wheezing, rhonchi or rales.  Abdominal:     General: Abdomen is flat.     Palpations: There is no mass.     Tenderness: There is no abdominal tenderness. There is no guarding.     Hernia: No hernia is present.  Musculoskeletal:        General: Normal range of motion.     Cervical back: Neck supple.     Right lower leg: No edema.     Left lower leg: No edema.  Lymphadenopathy:     Cervical: No cervical adenopathy.  Skin:    General: Skin is warm and dry.     Findings: No erythema, lesion or rash.  Neurological:     General: No focal deficit present.     Mental Status: She is alert.  Psychiatric:        Mood and Affect: Mood normal.        Behavior: Behavior normal.    Lab Results  Component Value Date   WBC 5.8 03/15/2021   HGB 13.2 03/15/2021   HCT 39.8 03/15/2021   PLT 331.0 03/15/2021   GLUCOSE 85 05/18/2020   CHOL 237 (H) 05/18/2020   TRIG 83.0 05/18/2020   HDL 61.80 05/18/2020   LDLCALC 158 (H) 05/18/2020   ALT 15 05/18/2020   AST 20 05/18/2020   NA 139 05/18/2020   K 4.0 05/18/2020   CL 104 05/18/2020   CREATININE 0.85 05/18/2020   BUN 17 05/18/2020   CO2 29 05/18/2020   TSH 3.92 03/15/2021    DG Bone Density  Result Date: 07/02/2020 Date of study: 06/29/20 Exam: DUAL X-RAY ABSORPTIOMETRY (DXA) FOR BONE MINERAL DENSITY (BMD) Instrument: [employer] Provider: PCP Indication: screening for osteoporosis Comparison: none (please note that it is not possible to compare data from different instruments) Clinical data: Pt is a 65 y.o. female without previous fractures. Results:  Lumbar spine L1-L4 Femoral neck (FN) 33% distal radius T-score 2.0 RFN: -0.8 LFN: -0.8 n/a Change in BMD from previous DXA test (%) n/a n/a n/a  (*) statistically significant Assessment: the BMD is normal according to the [facility] classification for osteoporosis (see below). Fracture risk: low FRAX score: not calculated due to normal BMD Comments: the technical quality of the study is good Recommend optimizing calcium (1200 mg/day) and vitamin D (800 IU/day) intake. No pharmacological treatment is indicated. Followup: Repeat BMD is appropriate after 2 years. WHO criteria for diagnosis of osteoporosis in postmenopausal women and in men 66 y/o or older: - normal: T-score -1.0 to +  1.0 - osteopenia/low bone density: T-score between -2.5 and -1.0 - osteoporosis: T-score below -2.5 - severe osteoporosis: T-score below -2.5 with history of fragility fracture Note: although not part of the WHO classification, the presence of a fragility fracture, regardless of the T-score, should be considered diagnostic of osteoporosis, provided other causes for the fracture have been excluded. Loura Pardon MD    Assessment & Plan:   Bellany was seen today for annual exam.  Diagnoses and all orders for this visit:  Hypothyroidism, unspecified type- Her TSH is in the normal range.  She will stay on the current dose of levothyroxine. -     TSH; Future -     TSH  Encounter for general adult medical examination with abnormal findings- Exam completed, labs reviewed - statin therapy is not indicated., vaccines reviewed and updated, cancer screenings addressed, patient education was given.  Screen for colon cancer -     Cologuard  Raynaud's phenomenon without gangrene- Will screen for rheumatologic disease.  I advised her to keep her hands protected from cold weather. -     ANA; Future -     C-reactive protein; Future -     Cyclic citrul peptide antibody, IgG; Future -     Cyclic citrul peptide antibody, IgG -     C-reactive protein -     ANA  RLS (restless legs syndrome)- Will screen for iron deficiency.  I have also asked her to see sleep medicine. -     CBC with  Differential/Platelet; Future -     IBC + Ferritin; Future -     IBC + Ferritin -     CBC with Differential/Platelet -     Ambulatory referral to Sleep Studies  Chronic primary musculoskeletal pain -     cyclobenzaprine (FLEXERIL) 5 MG tablet; Take 1 tablet (5 mg total) by mouth 3 (three) times daily as needed for muscle spasms.  I am having Gillermo Murdoch. Seyer start on cyclobenzaprine. I am also having her maintain her Multiple Vitamins-Minerals (MULTIVITAMIN & MINERAL PO), Biotin, cholecalciferol, fluocinonide cream, valACYclovir, levothyroxine, cetirizine, fluticasone, topiramate, rizatriptan, and diclofenac Sodium.  Meds ordered this encounter  Medications   cyclobenzaprine (FLEXERIL) 5 MG tablet    Sig: Take 1 tablet (5 mg total) by mouth 3 (three) times daily as needed for muscle spasms.    Dispense:  270 tablet    Refill:  0      Follow-up: Return in about 3 months (around 06/15/2021).  Scarlette Calico, MD

## 2021-03-16 ENCOUNTER — Encounter: Payer: Self-pay | Admitting: Internal Medicine

## 2021-03-16 MED ORDER — CYCLOBENZAPRINE HCL 5 MG PO TABS: 5.0000 mg | ORAL_TABLET | Freq: Three times a day (TID) | ORAL | 0 refills | Status: DC | PRN

## 2021-03-19 ENCOUNTER — Encounter: Payer: Self-pay | Admitting: Internal Medicine

## 2021-03-20 ENCOUNTER — Other Ambulatory Visit: Payer: Self-pay | Admitting: Internal Medicine

## 2021-03-20 DIAGNOSIS — I7300 Raynaud's syndrome without gangrene: Secondary | ICD-10-CM

## 2021-03-20 DIAGNOSIS — R768 Other specified abnormal immunological findings in serum: Secondary | ICD-10-CM | POA: Insufficient documentation

## 2021-03-20 LAB — ANTI-NUCLEAR AB-TITER (ANA TITER): ANA Titer 1: 1:80 {titer} — ABNORMAL HIGH

## 2021-03-20 LAB — ANA: Anti Nuclear Antibody (ANA): POSITIVE — AB

## 2021-03-20 LAB — CYCLIC CITRUL PEPTIDE ANTIBODY, IGG: Cyclic Citrullin Peptide Ab: <16 UNITS

## 2021-03-29 ENCOUNTER — Encounter: Payer: Self-pay | Admitting: Internal Medicine

## 2021-04-14 ENCOUNTER — Other Ambulatory Visit: Payer: Self-pay | Admitting: Internal Medicine

## 2021-04-14 DIAGNOSIS — R195 Other fecal abnormalities: Secondary | ICD-10-CM

## 2021-04-14 LAB — COLOGUARD: COLOGUARD: POSITIVE — AB

## 2021-05-12 NOTE — Progress Notes (Deleted)
Office Visit Note  Patient: Theresa Nichols             Date of Birth: [birth year]           MRN: [id number]             PCP: Janith Lima, MD Referring: Janith Lima, MD Visit Date: 05/26/2021 Occupation: @GUAROCC @  Subjective:  No chief complaint on file.   History of Present Illness: Theresa Nichols is a 66 y.o. female ***   Activities of Daily Living:  Patient reports morning stiffness for *** {minute/hour:19697}.   Patient {ACTIONS;DENIES/REPORTS:21021675::"Denies"} nocturnal pain.  Difficulty dressing/grooming: {ACTIONS;DENIES/REPORTS:21021675::"Denies"} Difficulty climbing stairs: {ACTIONS;DENIES/REPORTS:21021675::"Denies"} Difficulty getting out of chair: {ACTIONS;DENIES/REPORTS:21021675::"Denies"} Difficulty using hands for taps, buttons, cutlery, and/or writing: {ACTIONS;DENIES/REPORTS:21021675::"Denies"}  No Rheumatology ROS completed.   PMFS History:  Patient Active Problem List   Diagnosis Date Noted   Positive colorectal cancer screening using Cologuard test 04/14/2021   ANA positive 03/20/2021   Hypothyroidism 03/15/2021   Screen for colon cancer 03/15/2021   Encounter for general adult medical examination with abnormal findings 03/15/2021   Raynaud's phenomenon without gangrene 03/15/2021   RLS (restless legs syndrome) 03/15/2021   Common migraine with intractable migraine 09/27/2020    Past Medical History:  Diagnosis Date   Anxiety    Common migraine with intractable migraine 09/27/2020   Depression    History of concussion 09/27/2020   06/2017   Thyroid disease    hypothyroidism   Vitamin B12 deficiency     Family History  Problem Relation Age of Onset   Seizures Brother    Breast cancer Cousin    Past Surgical History:  Procedure Laterality Date   ABDOMINAL HYSTERECTOMY     DILATION AND CURETTAGE OF UTERUS     KNEE SURGERY     Social History   Social History Narrative   Lives with grandaughter   Left handed   Drinks 1-2  cups caffeine daily   Immunization History  Administered Date(s) Administered   Moderna Sars-Covid-2 Vaccination 07/03/2019, 07/31/2019   Td 04/23/2016     Objective: Vital Signs: There were no vitals taken for this visit.   Physical Exam   Musculoskeletal Exam: ***  CDAI Exam: CDAI Score: -- Patient Global: --; Provider Global: -- Swollen: --; Tender: -- Joint Exam 05/26/2021   No joint exam has been documented for this visit   There is currently no information documented on the homunculus. Go to the Rheumatology activity and complete the homunculus joint exam.  Investigation: No additional findings.  Imaging: No results found.  Recent Labs: Lab Results  Component Value Date   WBC 5.8 03/15/2021   HGB 13.2 03/15/2021   PLT 331.0 03/15/2021   NA 139 05/18/2020   K 4.0 05/18/2020   CL 104 05/18/2020   CO2 29 05/18/2020   GLUCOSE 85 05/18/2020   BUN 17 05/18/2020   CREATININE 0.85 05/18/2020   BILITOT 0.9 05/18/2020   ALKPHOS 74 05/18/2020   AST 20 05/18/2020   ALT 15 05/18/2020   PROT 6.8 05/18/2020   ALBUMIN 4.1 05/18/2020   CALCIUM 9.6 05/18/2020    Speciality Comments: No specialty comments available.  Procedures:  No procedures performed Allergies: Patient has no known allergies.   Assessment / Plan:     Visit Diagnoses: No diagnosis found.  Orders: No orders of the defined types were placed in this encounter.  No orders of the defined types were placed in this encounter.   Face-to-face time spent  with patient was *** minutes. Greater than 50% of time was spent in counseling and coordination of care.  Follow-Up Instructions: No follow-ups on file.   Ofilia Neas, PA-C  Note - This record has been created using Dragon software.  Chart creation errors have been sought, but may not always  have been located. Such creation errors do not reflect on  the standard of medical care.

## 2021-05-17 ENCOUNTER — Encounter: Payer: Self-pay | Admitting: Internal Medicine

## 2021-05-17 ENCOUNTER — Other Ambulatory Visit: Payer: Self-pay | Admitting: Family

## 2021-05-17 ENCOUNTER — Encounter: Payer: Self-pay | Admitting: Gastroenterology

## 2021-05-17 MED ORDER — DICLOFENAC SODIUM 1 % EX GEL: 2.0000 g | Freq: Four times a day (QID) | CUTANEOUS | 2 refills | Status: DC

## 2021-05-26 ENCOUNTER — Ambulatory Visit: Payer: 59 | Admitting: Rheumatology

## 2021-05-26 DIAGNOSIS — G43019 Migraine without aura, intractable, without status migrainosus: Secondary | ICD-10-CM

## 2021-05-26 DIAGNOSIS — I7300 Raynaud's syndrome without gangrene: Secondary | ICD-10-CM

## 2021-05-26 DIAGNOSIS — R768 Other specified abnormal immunological findings in serum: Secondary | ICD-10-CM

## 2021-05-26 DIAGNOSIS — G2581 Restless legs syndrome: Secondary | ICD-10-CM

## 2021-05-26 DIAGNOSIS — Z8639 Personal history of other endocrine, nutritional and metabolic disease: Secondary | ICD-10-CM

## 2021-06-01 ENCOUNTER — Other Ambulatory Visit: Payer: Self-pay | Admitting: Internal Medicine

## 2021-06-01 DIAGNOSIS — G8929 Other chronic pain: Secondary | ICD-10-CM

## 2021-06-02 MED ORDER — CYCLOBENZAPRINE HCL 5 MG PO TABS: 5.0000 mg | ORAL_TABLET | Freq: Three times a day (TID) | ORAL | 0 refills | Status: DC | PRN

## 2021-06-12 ENCOUNTER — Institutional Professional Consult (permissible substitution): Payer: 59 | Admitting: Neurology

## 2021-06-16 ENCOUNTER — Ambulatory Visit (AMBULATORY_SURGERY_CENTER): Payer: 59 | Admitting: *Deleted

## 2021-06-16 ENCOUNTER — Other Ambulatory Visit: Payer: Self-pay

## 2021-06-16 VITALS — Ht 62.0 in | Wt 160.0 lb

## 2021-06-16 DIAGNOSIS — Z1211 Encounter for screening for malignant neoplasm of colon: Secondary | ICD-10-CM

## 2021-06-16 NOTE — Progress Notes (Signed)
No egg or soy allergy known to patient  No issues known to pt with past sedation with any surgeries or procedures Patient denies ever being told they had issues or difficulty with intubation  No FH of Malignant Hyperthermia Pt is not on diet pills Pt is not on  home 02  Pt is not on blood thinners  Pt denies issues with constipation  No A fib or A flutter  Pt is  vaccinated  for Covid   Due to the COVID-19 pandemic we are asking patients to follow certain guidelines in PV and the [facility]   Pt aware of COVID protocols and [facility] guidelines   PV completed over the phone. Pt verified name, DOB, address and insurance during PV today.  Pt mailed instruction packet with copy of consent form to read and not return, and instructions.  Pt encouraged to call with questions or issues.  If pt has My chart, procedure instructions sent via My Chart   Sample sheet of over the counter items to purchase mailed with packet.  Colonoscopy classification sheet mailed with packet.

## 2021-06-17 IMAGING — MG MM DIGITAL SCREENING BILAT W/ TOMO AND CAD
8 series · 8 of 24 positions shown · non-contrast
Comparison: Previous exam(s).

CLINICAL DATA: Screening.

EXAM:
DIGITAL SCREENING BILATERAL MAMMOGRAM WITH TOMOSYNTHESIS AND CAD
TECHNIQUE: Bilateral screening digital craniocaudal and mediolateral oblique
mammograms were obtained. Bilateral screening digital breast
tomosynthesis was performed. The images were evaluated with
computer-aided detection.

[R MLO synth-2D]
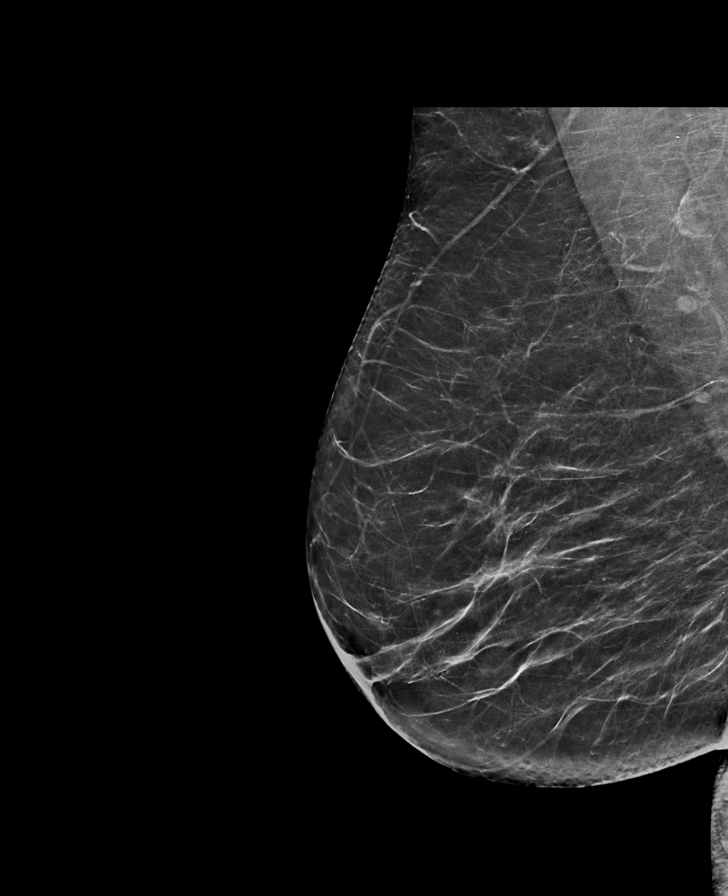

[L CC synth-2D]
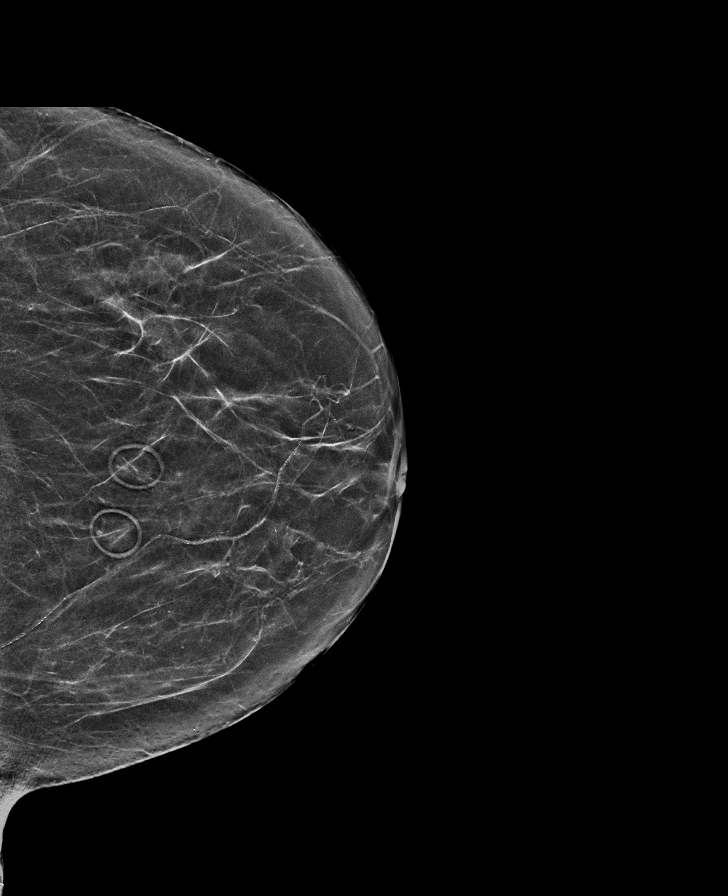

[L MLO synth-2D]
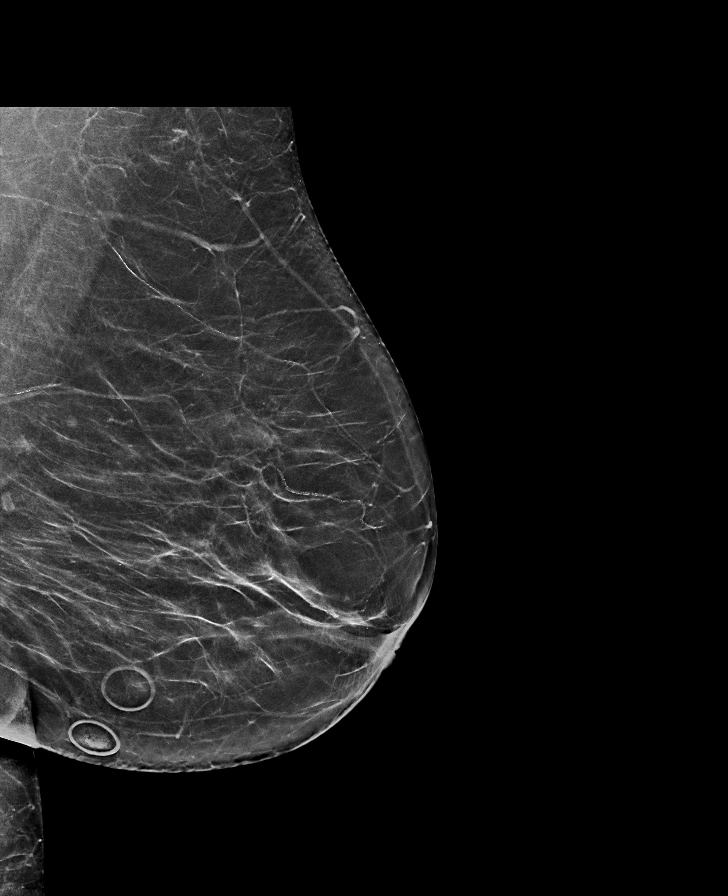

[R CC synth-2D]
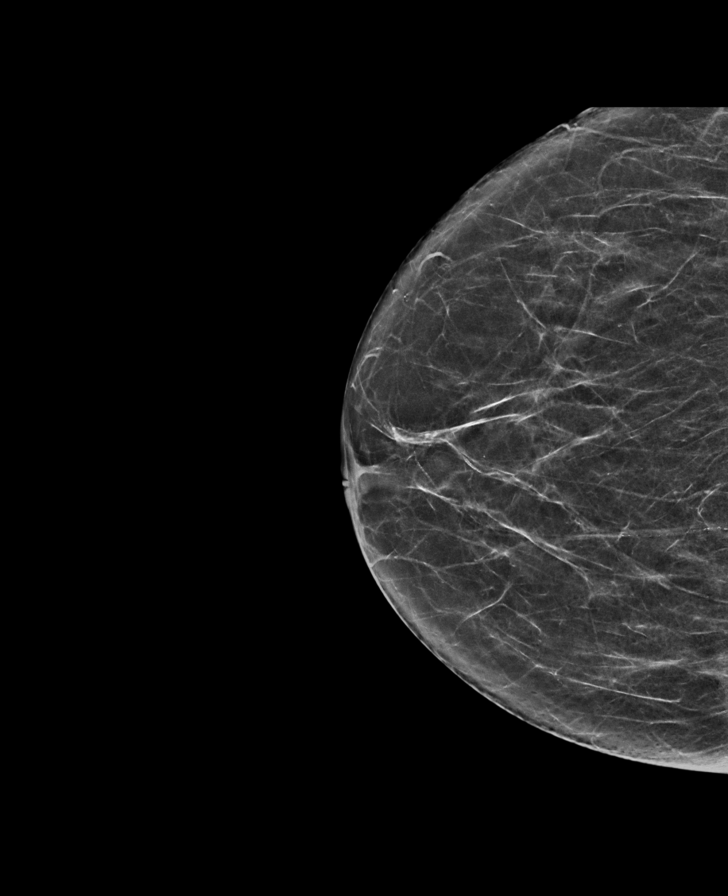

[L CC tomo · tomo slice 31/61.0]
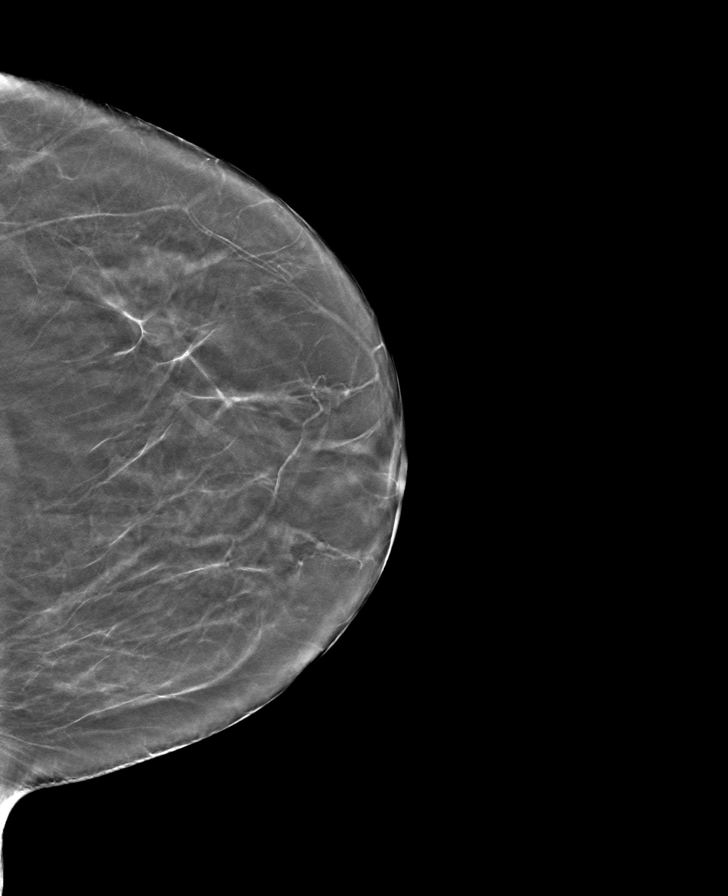

[R CC tomo · tomo slice 31/61.0]
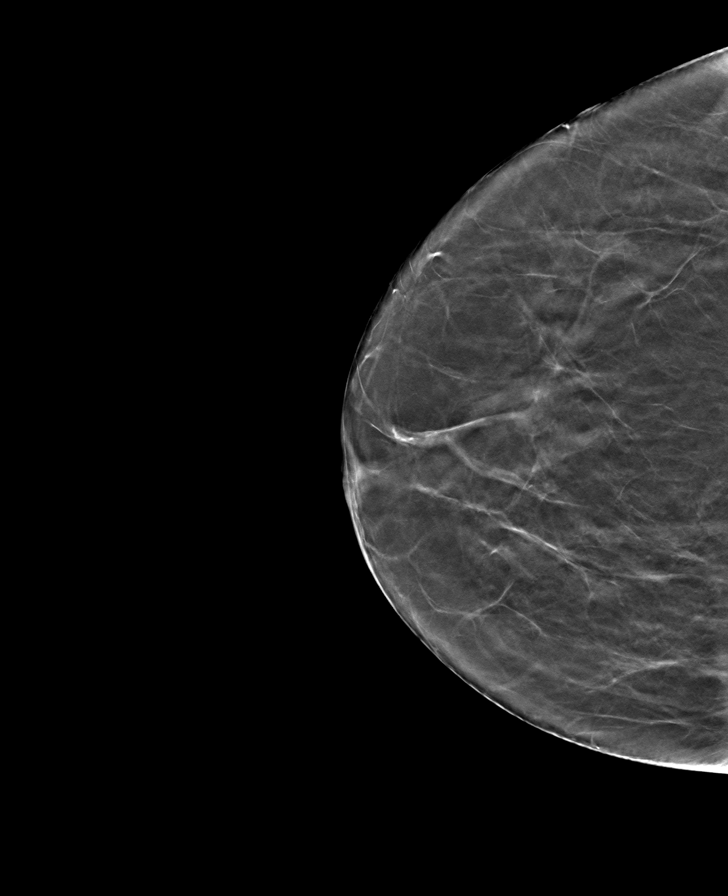

[L MLO tomo · tomo slice 35/70.0]
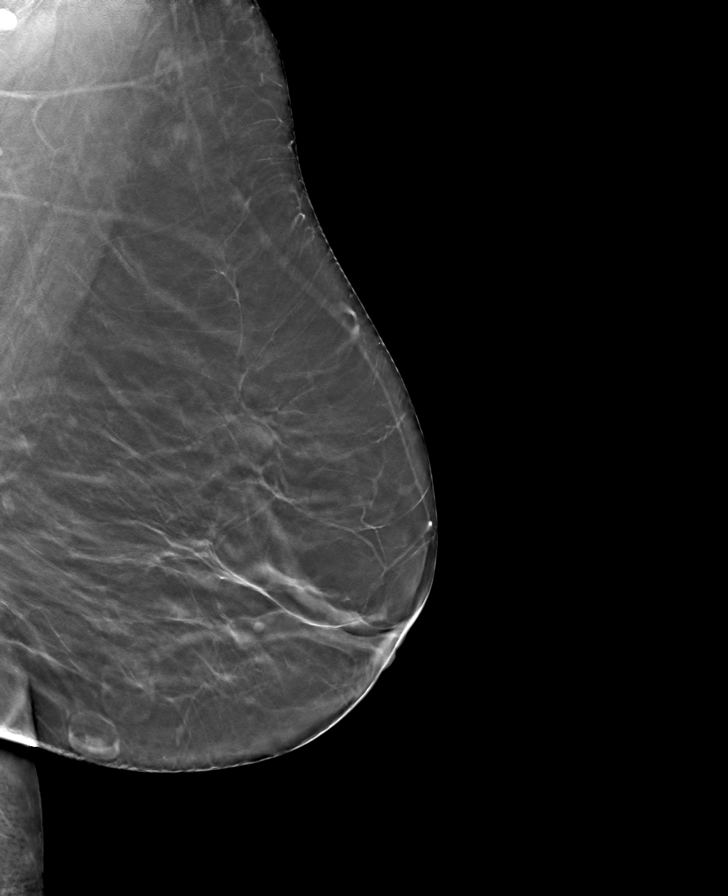

[R MLO tomo · tomo slice 34/67.0]
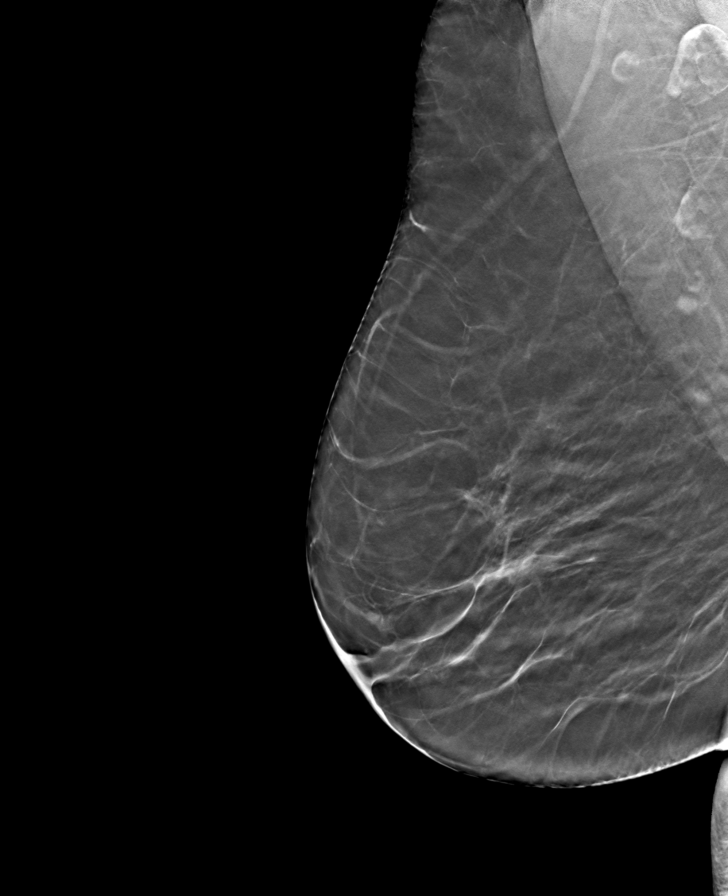

[8 of 24 positions shown; findings below may reference images not displayed]

ACR Breast Density Category b: There are scattered areas of
fibroglandular density.
FINDINGS: There are no findings suspicious for malignancy.
IMPRESSION: No mammographic evidence of malignancy. A result letter of this
screening mammogram will be mailed directly to the patient.

RECOMMENDATION:
Screening mammogram in one year. (Code:51-O-LD2)

BI-RADS CATEGORY  1: Negative.

## 2021-06-19 ENCOUNTER — Ambulatory Visit (INDEPENDENT_AMBULATORY_CARE_PROVIDER_SITE_OTHER): Payer: 59 | Admitting: Internal Medicine

## 2021-06-19 ENCOUNTER — Other Ambulatory Visit: Payer: Self-pay

## 2021-06-19 ENCOUNTER — Encounter: Payer: Self-pay | Admitting: Internal Medicine

## 2021-06-19 VITALS — BP 136/82 | HR 75 | Temp 97.9°F | Resp 16 | Ht 62.0 in | Wt 166.0 lb

## 2021-06-19 DIAGNOSIS — G43019 Migraine without aura, intractable, without status migrainosus: Secondary | ICD-10-CM | POA: Diagnosis not present

## 2021-06-19 DIAGNOSIS — J301 Allergic rhinitis due to pollen: Secondary | ICD-10-CM

## 2021-06-19 MED ORDER — FLUTICASONE PROPIONATE 50 MCG/ACT NA SUSP: 2.0000 | Freq: Every day | NASAL | 1 refills | Status: DC

## 2021-06-19 MED ORDER — NURTEC 75 MG PO TBDP: 1.0000 | ORAL_TABLET | ORAL | 1 refills | Status: DC

## 2021-06-19 MED ORDER — CETIRIZINE HCL 10 MG PO TABS: 10.0000 mg | ORAL_TABLET | Freq: Every day | ORAL | 1 refills | Status: DC

## 2021-06-19 NOTE — Patient Instructions (Signed)
Migraine Headache A migraine headache is an intense, throbbing pain on one side or both sides of the head. Migraine headaches may also cause other symptoms, such as nausea, vomiting, and sensitivity to light and noise. A migraine headache can last from 4 hours to 3 days. Talk with your doctor about what things may bring on (trigger) your migraine headaches. What are the causes? The exact cause of this condition is not known. However, a migraine may be caused when nerves in the brain become irritated and release chemicals that cause inflammation of blood vessels. This inflammation causes pain. This condition may be triggered or caused by: Drinking alcohol. Smoking. Taking medicines, such as: Medicine used to treat chest pain (nitroglycerin). Birth control pills. Estrogen. Certain blood pressure medicines. Eating or drinking products that contain nitrates, glutamate, aspartame, or tyramine. Aged cheeses, chocolate, or caffeine may also be triggers. Doing physical activity. Other things that may trigger a migraine headache include: Menstruation. Pregnancy. Hunger. Stress. Lack of sleep or too much sleep. Weather changes. Fatigue. What increases the risk? The following factors may make you more likely to experience migraine headaches: Being a certain age. This condition is more common in people who are 25-55 years old. Being female. Having a family history of migraine headaches. Being Caucasian. Having a mental health condition, such as depression or anxiety. Being obese. What are the signs or symptoms? The main symptom of this condition is pulsating or throbbing pain. This pain may: Happen in any area of the head, such as on one side or both sides. Interfere with daily activities. Get worse with physical activity. Get worse with exposure to bright lights or loud noises. Other symptoms may include: Nausea. Vomiting. Dizziness. General sensitivity to bright lights, loud noises, or  smells. Before you get a migraine headache, you may get warning signs (an aura). An aura may include: Seeing flashing lights or having blind spots. Seeing bright spots, halos, or zigzag lines. Having tunnel vision or blurred vision. Having numbness or a tingling feeling. Having trouble talking. Having muscle weakness. Some people have symptoms after a migraine headache (postdromal phase), such as: Feeling tired. Difficulty concentrating. How is this diagnosed? A migraine headache can be diagnosed based on: Your symptoms. A physical exam. Tests, such as: CT scan or an MRI of the head. These imaging tests can help rule out other causes of headaches. Taking fluid from the spine (lumbar puncture) and analyzing it (cerebrospinal fluid analysis, or CSF analysis). How is this treated? This condition may be treated with medicines that: Relieve pain. Relieve nausea. Prevent migraine headaches. Treatment for this condition may also include: Acupuncture. Lifestyle changes like avoiding foods that trigger migraine headaches. Biofeedback. Cognitive behavioral therapy. Follow these instructions at home: Medicines Take over-the-counter and prescription medicines only as told by your health care provider. Ask your health care provider if the medicine prescribed to you: Requires you to avoid driving or using heavy machinery. Can cause constipation. You may need to take these actions to prevent or treat constipation: Drink enough fluid to keep your urine pale yellow. Take over-the-counter or prescription medicines. Eat foods that are high in fiber, such as beans, whole grains, and fresh fruits and vegetables. Limit foods that are high in fat and processed sugars, such as fried or sweet foods. Lifestyle Do not drink alcohol. Do not use any products that contain nicotine or tobacco, such as cigarettes, e-cigarettes, and chewing tobacco. If you need help quitting, ask your health care  provider. Get at least 8   hours of sleep every night. Find ways to manage stress, such as meditation, deep breathing, or yoga. General instructions   Keep a journal to find out what may trigger your migraine headaches. For example, write down: What you eat and drink. How much sleep you get. Any change to your diet or medicines. If you have a migraine headache: Avoid things that make your symptoms worse, such as bright lights. It may help to lie down in a dark, quiet room. Do not drive or use heavy machinery. Ask your health care provider what activities are safe for you while you are experiencing symptoms. Keep all follow-up visits as told by your health care provider. This is important. Contact a health care provider if: You develop symptoms that are different or more severe than your usual migraine headache symptoms. You have more than 15 headache days in one month. Get help right away if: Your migraine headache becomes severe. Your migraine headache lasts longer than 72 hours. You have a fever. You have a stiff neck. You have vision loss. Your muscles feel weak or like you cannot control them. You start to lose your balance often. You have trouble walking. You faint. You have a seizure. Summary A migraine headache is an intense, throbbing pain on one side or both sides of the head. Migraines may also cause other symptoms, such as nausea, vomiting, and sensitivity to light and noise. This condition may be treated with medicines and lifestyle changes. You may also need to avoid certain things that trigger a migraine headache. Keep a journal to find out what may trigger your migraine headaches. Contact your health care provider if you have more than 15 headache days in a month or you develop symptoms that are different or more severe than your usual migraine headache symptoms. This information is not intended to replace advice given to you by your health care provider. Make sure you  discuss any questions you have with your health care provider. Document Revised: 08/01/2018 Document Reviewed: 05/22/2018 Elsevier Patient Education  2022 [employer]  

## 2021-06-19 NOTE — Progress Notes (Signed)
Subjective:  Patient ID: Theresa Nichols, female    DOB: 1955-09-25  Age: 66 y.o. MRN: [id number]  CC: Hypothyroidism and Headache  This visit occurred during the SARS-CoV-2 public health emergency.  Safety protocols were in place, including screening questions prior to the visit, additional usage of staff PPE, and extensive cleaning of exam room while observing appropriate contact time as indicated for disinfecting solutions.    HPI Theresa Nichols presents for f/up -   She continues to get about 2 migraine headaches each week.  She is not getting much symptom relief with Tylenol.  She stopped taking Maxalt and Topamax because they cause blurred vision.  She says her vision since has been normal.  She denies nausea, vomiting, or paresthesias.  Outpatient Medications Prior to Visit  Medication Sig Dispense Refill   Biotin 1 MG CAPS Take 1,000 mg by mouth daily.     cholecalciferol (VITAMIN D) 1000 UNITS tablet Take 1,000 Units by mouth daily.     cyclobenzaprine (FLEXERIL) 5 MG tablet Take 1 tablet (5 mg total) by mouth 3 (three) times daily as needed for muscle spasms. 270 tablet 0   diclofenac Sodium (VOLTAREN) 1 % GEL Apply 2 g topically 4 (four) times daily. 350 g 2   fluocinonide cream (LIDEX) 1.44 % Apply 1 application topically as needed.     levothyroxine (SYNTHROID) 75 MCG tablet Take 1 tablet (75 mcg total) by mouth daily before breakfast. 90 tablet 2   Multiple Vitamins-Minerals (MULTIVITAMIN & MINERAL PO) Take by mouth daily.     valACYclovir (VALTREX) 1000 MG tablet Take 1 tablet (1,000 mg total) by mouth 2 (two) times daily. (Patient taking differently: Take 1,000 mg by mouth as needed.) 30 tablet 1   cetirizine (ZYRTEC) 10 MG tablet Take 1 tablet (10 mg total) by mouth daily. 30 tablet 2   fluticasone (FLONASE) 50 MCG/ACT nasal spray Place 2 sprays into both nostrils daily. 16 g 2   rizatriptan (MAXALT) 10 MG tablet Take 1 tablet (10 mg total) by mouth 3 (three) times  daily as needed for migraine. May repeat in 2 hours if needed 10 tablet 3   topiramate (TOPAMAX) 25 MG tablet Take one tablet at night for one week, then take 2 tablets at night for one week, then take 3 tablets at night. 90 tablet 3   No facility-administered medications prior to visit.    ROS Review of Systems  Constitutional:  Negative for appetite change, diaphoresis and fatigue.  HENT: Negative.    Eyes: Negative.   Respiratory:  Negative for cough, chest tightness and shortness of breath.   Cardiovascular:  Negative for chest pain, palpitations and leg swelling.  Gastrointestinal:  Negative for abdominal pain, constipation, diarrhea, nausea and vomiting.  Genitourinary: Negative.  Negative for difficulty urinating.  Musculoskeletal:  Negative for arthralgias.  Skin: Negative.   Neurological:  Positive for headaches. Negative for dizziness, weakness, light-headedness and numbness.  Hematological:  Negative for adenopathy. Does not bruise/bleed easily.  Psychiatric/Behavioral: Negative.     Objective:  BP 136/82 (BP Location: Right Arm, Patient Position: Sitting, Cuff Size: Large)    Pulse 75    Temp 97.9 F (36.6 C) (Oral)    Resp 16    Ht 5\' 2"  (1.575 m)    Wt 166 lb (75.3 kg)    SpO2 95%    BMI 30.36 kg/m   BP Readings from Last 3 Encounters:  06/19/21 136/82  03/15/21 126/84  09/27/20 134/82  Wt Readings from Last 3 Encounters:  06/19/21 166 lb (75.3 kg)  06/16/21 160 lb (72.6 kg)  03/15/21 162 lb (73.5 kg)    Physical Exam Vitals reviewed.  Constitutional:      Appearance: She is not ill-appearing.  HENT:     Nose: Nose normal.     Mouth/Throat:     Mouth: Mucous membranes are moist.  Eyes:     Conjunctiva/sclera: Conjunctivae normal.  Cardiovascular:     Rate and Rhythm: Normal rate and regular rhythm.     Heart sounds: No murmur heard. Pulmonary:     Effort: Pulmonary effort is normal.     Breath sounds: No stridor. No wheezing or rhonchi.   Abdominal:     General: Abdomen is flat.     Palpations: There is no mass.     Tenderness: There is no abdominal tenderness. There is no guarding.     Hernia: No hernia is present.  Musculoskeletal:        General: Normal range of motion.     Cervical back: Neck supple.     Right lower leg: No edema.     Left lower leg: No edema.  Lymphadenopathy:     Cervical: No cervical adenopathy.  Skin:    General: Skin is warm and dry.  Neurological:     General: No focal deficit present.     Mental Status: She is alert.  Psychiatric:        Behavior: Behavior normal.    Lab Results  Component Value Date   WBC 5.8 03/15/2021   HGB 13.2 03/15/2021   HCT 39.8 03/15/2021   PLT 331.0 03/15/2021   GLUCOSE 85 05/18/2020   CHOL 237 (H) 05/18/2020   TRIG 83.0 05/18/2020   HDL 61.80 05/18/2020   LDLCALC 158 (H) 05/18/2020   ALT 15 05/18/2020   AST 20 05/18/2020   NA 139 05/18/2020   K 4.0 05/18/2020   CL 104 05/18/2020   CREATININE 0.85 05/18/2020   BUN 17 05/18/2020   CO2 29 05/18/2020   TSH 3.92 03/15/2021    DG Bone Density  Result Date: 07/02/2020 Date of study: 06/29/20 Exam: DUAL X-RAY ABSORPTIOMETRY (DXA) FOR BONE MINERAL DENSITY (BMD) Instrument: [employer] Provider: PCP Indication: screening for osteoporosis Comparison: none (please note that it is not possible to compare data from different instruments) Clinical data: Pt is a 66 y.o. female without previous fractures. Results:  Lumbar spine L1-L4 Femoral neck (FN) 33% distal radius T-score 2.0 RFN: -0.8 LFN: -0.8 n/a Change in BMD from previous DXA test (%) n/a n/a n/a (*) statistically significant Assessment: the BMD is normal according to the [facility] classification for osteoporosis (see below). Fracture risk: low FRAX score: not calculated due to normal BMD Comments: the technical quality of the study is good Recommend optimizing calcium (1200 mg/day) and vitamin D (800 IU/day) intake. No pharmacological treatment  is indicated. Followup: Repeat BMD is appropriate after 2 years. WHO criteria for diagnosis of osteoporosis in postmenopausal women and in men 70 y/o or older: - normal: T-score -1.0 to + 1.0 - osteopenia/low bone density: T-score between -2.5 and -1.0 - osteoporosis: T-score below -2.5 - severe osteoporosis: T-score below -2.5 with history of fragility fracture Note: although not part of the WHO classification, the presence of a fragility fracture, regardless of the T-score, should be considered diagnostic of osteoporosis, provided other causes for the fracture have been excluded. Loura Pardon MD    Assessment & Plan:  Akire was seen today for hypothyroidism and headache.  Diagnoses and all orders for this visit:  Seasonal allergic rhinitis due to pollen -     cetirizine (ZYRTEC) 10 MG tablet; Take 1 tablet (10 mg total) by mouth daily. -     fluticasone (FLONASE) 50 MCG/ACT nasal spray; Place 2 sprays into both nostrils daily.  Common migraine with intractable migraine- I recommended that she treat and prophylax for migraine headaches with a CGRP antagonist. -     Rimegepant Sulfate (NURTEC) 75 MG TBDP; Take 1 tablet by mouth every other day.   I have discontinued Gillermo Murdoch. Beckstrand's topiramate and rizatriptan. I am also having her start on Nurtec. Additionally, I am having her maintain her Multiple Vitamins-Minerals (MULTIVITAMIN & MINERAL PO), Biotin, cholecalciferol, fluocinonide cream, valACYclovir, levothyroxine, diclofenac Sodium, cyclobenzaprine, cetirizine, and fluticasone.  Meds ordered this encounter  Medications   cetirizine (ZYRTEC) 10 MG tablet    Sig: Take 1 tablet (10 mg total) by mouth daily.    Dispense:  90 tablet    Refill:  1   fluticasone (FLONASE) 50 MCG/ACT nasal spray    Sig: Place 2 sprays into both nostrils daily.    Dispense:  48 g    Refill:  1   Rimegepant Sulfate (NURTEC) 75 MG TBDP    Sig: Take 1 tablet by mouth every other day.    Dispense:  46  tablet    Refill:  1     Follow-up: Return in about 3 months (around 09/16/2021).  Scarlette Calico, MD

## 2021-06-23 ENCOUNTER — Ambulatory Visit: Payer: 59 | Admitting: Rheumatology

## 2021-06-26 ENCOUNTER — Telehealth: Payer: Self-pay | Admitting: Gastroenterology

## 2021-06-26 NOTE — Telephone Encounter (Signed)
Hey Dr. Tarri Glenn,  ? ?Patient called in to cancel procedure 3/10 due to something happening in her family. She rescheduled for 5/19. ? ?Thank you ?

## 2021-06-26 NOTE — Telephone Encounter (Signed)
Noted  

## 2021-06-30 ENCOUNTER — Encounter: Payer: 59 | Admitting: Gastroenterology

## 2021-07-13 ENCOUNTER — Ambulatory Visit: Payer: 59 | Admitting: Internal Medicine

## 2021-07-24 ENCOUNTER — Telehealth: Payer: Self-pay

## 2021-07-24 NOTE — Telephone Encounter (Signed)
Hard copy PA for Nurtec received, completed, and faxed back.  ?

## 2021-07-26 ENCOUNTER — Institutional Professional Consult (permissible substitution): Payer: 59 | Admitting: Neurology

## 2021-08-01 NOTE — Telephone Encounter (Signed)
Approved ? ?Valid through 04/22/22. ?Determination sent to scan. ?

## 2021-08-01 NOTE — Telephone Encounter (Signed)
[employer] has faxed over forms needing additional info in regard to this PA request.  ? ?Form has been completed and faxed back.  ?

## 2021-08-02 ENCOUNTER — Telehealth: Payer: Self-pay | Admitting: Internal Medicine

## 2021-08-02 NOTE — Telephone Encounter (Signed)
N/A unable to leave a message for patient to call back to schedule Medicare Annual Wellness Visit  ? ?No hx of AWV eligible as of 12/22/20 ? ?Please schedule at anytime with [facility] if patient calls the office back.   ? ? ? ?Any questions, please call me at [phone]  ?

## 2021-08-27 NOTE — Progress Notes (Deleted)
Office Visit Note  Patient: Theresa Nichols             Date of Birth: 1956-03-10           MRN: [id number]             PCP: Janith Lima, MD Referring: Janith Lima, MD Visit Date: 08/28/2021 Occupation: '@GUAROCC'$ @  Subjective:  No chief complaint on file.   History of Present Illness: Theresa Nichols is a 66 y.o. female here for evaluation of positive ANA checked in association with raynaud's symptoms and multiple joint pains. She developed triphasic finger discoloration precipitated by cold exposure starting last year.***   Labs reviewed 02/2021 ANA 1:80 cytoplasmic, speckled CRP wnl TSH wnl  Activities of Daily Living:  Patient reports morning stiffness for *** {minute/hour:19697}.   Patient {ACTIONS;DENIES/REPORTS:21021675::"Denies"} nocturnal pain.  Difficulty dressing/grooming: {ACTIONS;DENIES/REPORTS:21021675::"Denies"} Difficulty climbing stairs: {ACTIONS;DENIES/REPORTS:21021675::"Denies"} Difficulty getting out of chair: {ACTIONS;DENIES/REPORTS:21021675::"Denies"} Difficulty using hands for taps, buttons, cutlery, and/or writing: {ACTIONS;DENIES/REPORTS:21021675::"Denies"}  No Rheumatology ROS completed.   PMFS History:  Patient Active Problem List   Diagnosis Date Noted   Positive colorectal cancer screening using Cologuard test 04/14/2021   ANA positive 03/20/2021   Hypothyroidism 03/15/2021   Screen for colon cancer 03/15/2021   Encounter for general adult medical examination with abnormal findings 03/15/2021   Raynaud's phenomenon without gangrene 03/15/2021   RLS (restless legs syndrome) 03/15/2021   Common migraine with intractable migraine 09/27/2020    Past Medical History:  Diagnosis Date   Allergy    seasonal   Anxiety    Arthritis    knees,left hip   Blood transfusion without reported diagnosis    1983   Common migraine with intractable migraine 09/27/2020   Depression    History of concussion 09/27/2020   06/2017   Thyroid  disease    hypothyroidism   Vitamin B12 deficiency     Family History  Problem Relation Age of Onset   Seizures Brother    Breast cancer Cousin    Colon cancer Neg Hx    Colon polyps Neg Hx    Esophageal cancer Neg Hx    Rectal cancer Neg Hx    Stomach cancer Neg Hx    Past Surgical History:  Procedure Laterality Date   ABDOMINAL HYSTERECTOMY     DILATION AND CURETTAGE OF UTERUS     x3   KNEE SURGERY Right    Social History   Social History Narrative   Lives with grandaughter   Left handed   Drinks 1-2 cups caffeine daily   Immunization History  Administered Date(s) Administered   Moderna Sars-Covid-2 Vaccination 07/03/2019, 07/31/2019   Td 04/23/2016     Objective: Vital Signs: There were no vitals taken for this visit.   Physical Exam   Musculoskeletal Exam: ***  CDAI Exam: CDAI Score: -- Patient Global: --; Provider Global: -- Swollen: --; Tender: -- Joint Exam 08/28/2021   No joint exam has been documented for this visit   There is currently no information documented on the homunculus. Go to the Rheumatology activity and complete the homunculus joint exam.  Investigation: No additional findings.  Imaging: No results found.  Recent Labs: Lab Results  Component Value Date   WBC 5.8 03/15/2021   HGB 13.2 03/15/2021   PLT 331.0 03/15/2021   NA 139 05/18/2020   K 4.0 05/18/2020   CL 104 05/18/2020   CO2 29 05/18/2020   GLUCOSE 85 05/18/2020   BUN 17 05/18/2020  CREATININE 0.85 05/18/2020   BILITOT 0.9 05/18/2020   ALKPHOS 74 05/18/2020   AST 20 05/18/2020   ALT 15 05/18/2020   PROT 6.8 05/18/2020   ALBUMIN 4.1 05/18/2020   CALCIUM 9.6 05/18/2020    Speciality Comments: No specialty comments available.  Procedures:  No procedures performed Allergies: Maxalt [rizatriptan] and Topamax [topiramate]   Assessment / Plan:     Visit Diagnoses: No diagnosis found.  Orders: No orders of the defined types were placed in this  encounter.  No orders of the defined types were placed in this encounter.   Face-to-face time spent with patient was *** minutes. Greater than 50% of time was spent in counseling and coordination of care.  Follow-Up Instructions: No follow-ups on file.   Collier Salina, MD  Note - This record has been created using [employer].  Chart creation errors have been sought, but may not always  have been located. Such creation errors do not reflect on  the standard of medical care.

## 2021-08-28 ENCOUNTER — Ambulatory Visit: Payer: 59 | Admitting: Internal Medicine

## 2021-09-06 ENCOUNTER — Telehealth: Payer: Self-pay | Admitting: Gastroenterology

## 2021-09-06 NOTE — Telephone Encounter (Signed)
Noted  

## 2021-09-06 NOTE — Telephone Encounter (Signed)
Good Morning Dr. Tarri Glenn, ? ? ?Patient called stating she needed to reschedule her colonoscopy for 5/19 due to having to go out of town for her granddaughter who is having a baby and is hemorrhaging. ? ?Patient stated she will call back at a later time to reschedule.  ?

## 2021-09-08 ENCOUNTER — Encounter: Payer: 59 | Admitting: Gastroenterology

## 2021-09-25 ENCOUNTER — Ambulatory Visit (INDEPENDENT_AMBULATORY_CARE_PROVIDER_SITE_OTHER): Payer: 59 | Admitting: Internal Medicine

## 2021-09-25 ENCOUNTER — Encounter: Payer: Self-pay | Admitting: Internal Medicine

## 2021-09-25 VITALS — BP 126/72 | HR 70 | Temp 98.0°F | Ht 62.0 in | Wt 170.0 lb

## 2021-09-25 DIAGNOSIS — R7989 Other specified abnormal findings of blood chemistry: Secondary | ICD-10-CM

## 2021-09-25 DIAGNOSIS — J301 Allergic rhinitis due to pollen: Secondary | ICD-10-CM | POA: Diagnosis not present

## 2021-09-25 DIAGNOSIS — Z0001 Encounter for general adult medical examination with abnormal findings: Secondary | ICD-10-CM | POA: Diagnosis not present

## 2021-09-25 DIAGNOSIS — Z1231 Encounter for screening mammogram for malignant neoplasm of breast: Secondary | ICD-10-CM

## 2021-09-25 DIAGNOSIS — Z23 Encounter for immunization: Secondary | ICD-10-CM | POA: Diagnosis not present

## 2021-09-25 DIAGNOSIS — M170 Bilateral primary osteoarthritis of knee: Secondary | ICD-10-CM | POA: Diagnosis not present

## 2021-09-25 DIAGNOSIS — M545 Low back pain, unspecified: Secondary | ICD-10-CM

## 2021-09-25 DIAGNOSIS — B002 Herpesviral gingivostomatitis and pharyngotonsillitis: Secondary | ICD-10-CM | POA: Diagnosis not present

## 2021-09-25 DIAGNOSIS — E538 Deficiency of other specified B group vitamins: Secondary | ICD-10-CM

## 2021-09-25 DIAGNOSIS — E039 Hypothyroidism, unspecified: Secondary | ICD-10-CM

## 2021-09-25 DIAGNOSIS — E785 Hyperlipidemia, unspecified: Secondary | ICD-10-CM

## 2021-09-25 DIAGNOSIS — G8929 Other chronic pain: Secondary | ICD-10-CM

## 2021-09-25 MED ORDER — VALACYCLOVIR HCL 500 MG PO TABS: 500.0000 mg | ORAL_TABLET | Freq: Every day | ORAL | 1 refills | Status: DC

## 2021-09-25 MED ORDER — CYCLOBENZAPRINE HCL 5 MG PO TABS: 5.0000 mg | ORAL_TABLET | Freq: Three times a day (TID) | ORAL | 0 refills | Status: DC | PRN

## 2021-09-25 MED ORDER — FLUTICASONE PROPIONATE 50 MCG/ACT NA SUSP: 2.0000 | Freq: Every day | NASAL | 1 refills | Status: DC

## 2021-09-25 MED ORDER — DICLOFENAC SODIUM 1 % EX GEL: 2.0000 g | Freq: Four times a day (QID) | CUTANEOUS | 2 refills | Status: DC

## 2021-09-25 NOTE — Progress Notes (Unsigned)
Subjective:  Patient ID: Theresa Nichols, female    DOB: 30-Mar-1956  Age: 66 y.o. MRN: [id number]  CC: No chief complaint on file.   HPI Theresa Nichols presents for ***  Outpatient Medications Prior to Visit  Medication Sig Dispense Refill   Biotin 1 MG CAPS Take 1,000 mg by mouth daily.     cetirizine (ZYRTEC) 10 MG tablet Take 1 tablet (10 mg total) by mouth daily. 90 tablet 1   cholecalciferol (VITAMIN D) 1000 UNITS tablet Take 1,000 Units by mouth daily.     fluocinonide cream (LIDEX) 3.64 % Apply 1 application topically as needed.     levothyroxine (SYNTHROID) 75 MCG tablet Take 1 tablet (75 mcg total) by mouth daily before breakfast. 90 tablet 2   Multiple Vitamins-Minerals (MULTIVITAMIN & MINERAL PO) Take by mouth daily.     Rimegepant Sulfate (NURTEC) 75 MG TBDP Take 1 tablet by mouth every other day. 46 tablet 1   cyclobenzaprine (FLEXERIL) 5 MG tablet Take 1 tablet (5 mg total) by mouth 3 (three) times daily as needed for muscle spasms. 270 tablet 0   diclofenac Sodium (VOLTAREN) 1 % GEL Apply 2 g topically 4 (four) times daily. 350 g 2   fluticasone (FLONASE) 50 MCG/ACT nasal spray Place 2 sprays into both nostrils daily. 48 g 1   valACYclovir (VALTREX) 1000 MG tablet Take 1 tablet (1,000 mg total) by mouth 2 (two) times daily. (Patient taking differently: Take 1,000 mg by mouth as needed.) 30 tablet 1   No facility-administered medications prior to visit.    ROS Review of Systems  Objective:  BP 126/72 (BP Location: Left Arm, Patient Position: Sitting, Cuff Size: Large)   Pulse 70   Temp 98 F (36.7 C) (Oral)   Ht '5\' 2"'$  (1.575 m)   Wt 170 lb (77.1 kg)   SpO2 98%   BMI 31.09 kg/m   BP Readings from Last 3 Encounters:  09/25/21 126/72  06/19/21 136/82  03/15/21 126/84    Wt Readings from Last 3 Encounters:  09/25/21 170 lb (77.1 kg)  06/19/21 166 lb (75.3 kg)  06/16/21 160 lb (72.6 kg)    Physical Exam  Lab Results  Component Value Date    WBC 5.8 03/15/2021   HGB 13.2 03/15/2021   HCT 39.8 03/15/2021   PLT 331.0 03/15/2021   GLUCOSE 85 05/18/2020   CHOL 237 (H) 05/18/2020   TRIG 83.0 05/18/2020   HDL 61.80 05/18/2020   LDLCALC 158 (H) 05/18/2020   ALT 15 05/18/2020   AST 20 05/18/2020   NA 139 05/18/2020   K 4.0 05/18/2020   CL 104 05/18/2020   CREATININE 0.85 05/18/2020   BUN 17 05/18/2020   CO2 29 05/18/2020   TSH 3.92 03/15/2021    DG Bone Density  Result Date: 07/02/2020 Date of study: 06/29/20 Exam: DUAL X-RAY ABSORPTIOMETRY (DXA) FOR BONE MINERAL DENSITY (BMD) Instrument: [employer] Provider: PCP Indication: screening for osteoporosis Comparison: none (please note that it is not possible to compare data from different instruments) Clinical data: Pt is a 65 y.o. female without previous fractures. Results:  Lumbar spine L1-L4 Femoral neck (FN) 33% distal radius T-score 2.0 RFN: -0.8 LFN: -0.8 n/a Change in BMD from previous DXA test (%) n/a n/a n/a (*) statistically significant Assessment: the BMD is normal according to the [facility] classification for osteoporosis (see below). Fracture risk: low FRAX score: not calculated due to normal BMD Comments: the technical quality of the study  is good Recommend optimizing calcium (1200 mg/day) and vitamin D (800 IU/day) intake. No pharmacological treatment is indicated. Followup: Repeat BMD is appropriate after 2 years. WHO criteria for diagnosis of osteoporosis in postmenopausal women and in men 6 y/o or older: - normal: T-score -1.0 to + 1.0 - osteopenia/low bone density: T-score between -2.5 and -1.0 - osteoporosis: T-score below -2.5 - severe osteoporosis: T-score below -2.5 with history of fragility fracture Note: although not part of the WHO classification, the presence of a fragility fracture, regardless of the T-score, should be considered diagnostic of osteoporosis, provided other causes for the fracture have been excluded. Theresa Pardon MD    Assessment &  Plan:   Diagnoses and all orders for this visit:  Hypothyroidism, unspecified type -     TSH; Future -     Basic metabolic panel; Future  Seasonal allergic rhinitis due to pollen -     fluticasone (FLONASE) 50 MCG/ACT nasal spray; Place 2 sprays into both nostrils daily.  Chronic bilateral low back pain without sciatica -     cyclobenzaprine (FLEXERIL) 5 MG tablet; Take 1 tablet (5 mg total) by mouth 3 (three) times daily as needed for muscle spasms. -     Basic metabolic panel; Future  Primary osteoarthritis of both knees -     diclofenac Sodium (VOLTAREN) 1 % GEL; Apply 2 g topically 4 (four) times daily. -     Basic metabolic panel; Future  Recurrent oral herpes simplex -     valACYclovir (VALTREX) 500 MG tablet; Take 1 tablet (500 mg total) by mouth daily. -     Basic metabolic panel; Future  Encounter for general adult medical examination with abnormal findings  Low vitamin B12 level -     CBC with Differential/Platelet; Future -     Basic metabolic panel; Future -     Vitamin B12; Future -     Folate; Future  Hyperlipidemia LDL goal <130 -     Lipid panel; Future -     Hepatic function panel; Future -     Basic metabolic panel; Future  Other orders -     Varicella-zoster vaccine IM (Shingrix)   I have discontinued Theresa Nichols's valACYclovir. I am also having her start on valACYclovir. Additionally, I am having her maintain her Multiple Vitamins-Minerals (MULTIVITAMIN & MINERAL PO), Biotin, cholecalciferol, fluocinonide cream, levothyroxine, cetirizine, Nurtec, cyclobenzaprine, diclofenac Sodium, and fluticasone.  Meds ordered this encounter  Medications   cyclobenzaprine (FLEXERIL) 5 MG tablet    Sig: Take 1 tablet (5 mg total) by mouth 3 (three) times daily as needed for muscle spasms.    Dispense:  270 tablet    Refill:  0   diclofenac Sodium (VOLTAREN) 1 % GEL    Sig: Apply 2 g topically 4 (four) times daily.    Dispense:  350 g    Refill:  2    fluticasone (FLONASE) 50 MCG/ACT nasal spray    Sig: Place 2 sprays into both nostrils daily.    Dispense:  48 g    Refill:  1   valACYclovir (VALTREX) 500 MG tablet    Sig: Take 1 tablet (500 mg total) by mouth daily.    Dispense:  90 tablet    Refill:  1     Follow-up: Return in about 6 months (around 03/27/2022).  Scarlette Calico, MD

## 2021-09-25 NOTE — Patient Instructions (Signed)

## 2021-09-26 ENCOUNTER — Other Ambulatory Visit: Payer: Self-pay | Admitting: Internal Medicine

## 2021-09-26 ENCOUNTER — Encounter: Payer: Self-pay | Admitting: Internal Medicine

## 2021-09-26 ENCOUNTER — Other Ambulatory Visit: Payer: Self-pay | Admitting: Family

## 2021-09-26 DIAGNOSIS — E559 Vitamin D deficiency, unspecified: Secondary | ICD-10-CM | POA: Insufficient documentation

## 2021-09-26 MED ORDER — LORATADINE 10 MG PO TABS: 10.0000 mg | ORAL_TABLET | Freq: Every day | ORAL | 1 refills | Status: DC | PRN

## 2021-09-27 ENCOUNTER — Other Ambulatory Visit (INDEPENDENT_AMBULATORY_CARE_PROVIDER_SITE_OTHER): Payer: 59

## 2021-09-27 DIAGNOSIS — E559 Vitamin D deficiency, unspecified: Secondary | ICD-10-CM | POA: Diagnosis not present

## 2021-09-27 DIAGNOSIS — E538 Deficiency of other specified B group vitamins: Secondary | ICD-10-CM

## 2021-09-27 DIAGNOSIS — M170 Bilateral primary osteoarthritis of knee: Secondary | ICD-10-CM | POA: Insufficient documentation

## 2021-09-27 DIAGNOSIS — E785 Hyperlipidemia, unspecified: Secondary | ICD-10-CM | POA: Insufficient documentation

## 2021-09-27 DIAGNOSIS — E039 Hypothyroidism, unspecified: Secondary | ICD-10-CM | POA: Diagnosis not present

## 2021-09-27 DIAGNOSIS — G8929 Other chronic pain: Secondary | ICD-10-CM

## 2021-09-27 DIAGNOSIS — M545 Low back pain, unspecified: Secondary | ICD-10-CM | POA: Diagnosis not present

## 2021-09-27 DIAGNOSIS — B002 Herpesviral gingivostomatitis and pharyngotonsillitis: Secondary | ICD-10-CM | POA: Diagnosis not present

## 2021-09-27 DIAGNOSIS — Z1231 Encounter for screening mammogram for malignant neoplasm of breast: Secondary | ICD-10-CM | POA: Insufficient documentation

## 2021-09-27 DIAGNOSIS — Z114 Encounter for screening for human immunodeficiency virus [HIV]: Secondary | ICD-10-CM | POA: Insufficient documentation

## 2021-09-27 DIAGNOSIS — J301 Allergic rhinitis due to pollen: Secondary | ICD-10-CM | POA: Insufficient documentation

## 2021-09-27 LAB — BASIC METABOLIC PANEL
BUN: 26 mg/dL — ABNORMAL HIGH (ref 6–23)
CO2: 29 mEq/L (ref 19–32)
Calcium: 9.3 mg/dL (ref 8.4–10.5)
Chloride: 105 mEq/L (ref 96–112)
Creatinine, Ser: 0.82 mg/dL (ref 0.40–1.20)
GFR: 75.49 mL/min (ref >60.00)
Glucose, Bld: 84 mg/dL (ref 70–99)
Potassium: 4.6 mEq/L (ref 3.5–5.1)
Sodium: 140 mEq/L (ref 135–145)

## 2021-09-27 LAB — CBC WITH DIFFERENTIAL/PLATELET
Basophils Absolute: 0.1 10*3/uL (ref 0.0–0.1)
Basophils Relative: 1 % (ref 0.0–3.0)
Eosinophils Absolute: 0.2 10*3/uL (ref 0.0–0.7)
Eosinophils Relative: 3.6 % (ref 0.0–5.0)
HCT: 40.5 % (ref 36.0–46.0)
Hemoglobin: 13.4 g/dL (ref 12.0–15.0)
Lymphocytes Relative: 32.5 % (ref 12.0–46.0)
Lymphs Abs: 1.8 10*3/uL (ref 0.7–4.0)
MCHC: 33.1 g/dL (ref 30.0–36.0)
MCV: 90.1 fl (ref 78.0–100.0)
Monocytes Absolute: 0.7 10*3/uL (ref 0.1–1.0)
Monocytes Relative: 12 % (ref 3.0–12.0)
Neutro Abs: 2.7 10*3/uL (ref 1.4–7.7)
Neutrophils Relative %: 50.9 % (ref 43.0–77.0)
Platelets: 293 10*3/uL (ref 150.0–400.0)
RBC: 4.5 Mil/uL (ref 3.87–5.11)
RDW: 14.4 % (ref 11.5–15.5)
WBC: 5.4 10*3/uL (ref 4.0–10.5)

## 2021-09-27 LAB — HEPATIC FUNCTION PANEL
ALT: 15 U/L (ref 0–35)
AST: 16 U/L (ref 0–37)
Albumin: 4 g/dL (ref 3.5–5.2)
Alkaline Phosphatase: 75 U/L (ref 39–117)
Bilirubin, Direct: 0.1 mg/dL (ref 0.0–0.3)
Total Bilirubin: 0.6 mg/dL (ref 0.2–1.2)
Total Protein: 6.4 g/dL (ref 6.0–8.3)

## 2021-09-27 LAB — LIPID PANEL
Cholesterol: 202 mg/dL — ABNORMAL HIGH (ref 0–200)
HDL: 55.2 mg/dL (ref >39.00)
LDL Cholesterol: 130 mg/dL — ABNORMAL HIGH (ref 0–99)
NonHDL: 146.37
Total CHOL/HDL Ratio: 4
Triglycerides: 82 mg/dL (ref 0.0–149.0)
VLDL: 16.4 mg/dL (ref 0.0–40.0)

## 2021-09-27 LAB — FOLATE: Folate: >24.2 ng/mL (ref >5.9)

## 2021-09-27 LAB — TSH: TSH: 3.01 u[IU]/mL (ref 0.35–5.50)

## 2021-09-27 LAB — VITAMIN B12: Vitamin B-12: 350 pg/mL (ref 211–911)

## 2021-09-27 LAB — VITAMIN D 25 HYDROXY (VIT D DEFICIENCY, FRACTURES): VITD: 30.72 ng/mL (ref 30.00–100.00)

## 2021-09-27 MED ORDER — LEVOTHYROXINE SODIUM 75 MCG PO TABS: 75.0000 ug | ORAL_TABLET | Freq: Every day | ORAL | 1 refills | Status: DC

## 2021-09-29 ENCOUNTER — Encounter (INDEPENDENT_AMBULATORY_CARE_PROVIDER_SITE_OTHER): Payer: 59 | Admitting: Internal Medicine

## 2021-09-29 ENCOUNTER — Other Ambulatory Visit: Payer: 59

## 2021-09-29 DIAGNOSIS — B351 Tinea unguium: Secondary | ICD-10-CM

## 2021-10-19 ENCOUNTER — Other Ambulatory Visit: Payer: Self-pay | Admitting: Internal Medicine

## 2021-10-19 DIAGNOSIS — B351 Tinea unguium: Secondary | ICD-10-CM

## 2021-10-19 MED ORDER — FLUCONAZOLE 150 MG PO TABS: 150.0000 mg | ORAL_TABLET | ORAL | 0 refills | Status: AC

## 2021-10-19 NOTE — Telephone Encounter (Signed)
Please see the MyChart message reply(ies) for my assessment and plan.  The patient gave consent for this Medical Advice Message and is aware that it may result in a bill to their insurance company as well as the possibility that this may result in a co-payment or deductible. They are an established patient, but are not seeking medical advice exclusively about a problem treated during an in person or video visit in the last 7 days. I did not recommend an in person or video visit within 7 days of my reply.  I spent a total of 8 minutes cumulative time within 7 days through MyChart messaging Akito Boomhower, MD  

## 2021-11-01 ENCOUNTER — Ambulatory Visit
Admission: RE | Admit: 2021-11-01 | Discharge: 2021-11-01 | Disposition: A | Discharge status: Home / Self Care | Payer: 59 | Source: Ambulatory Visit | Attending: Internal Medicine | Admitting: Internal Medicine

## 2021-11-01 DIAGNOSIS — Z1231 Encounter for screening mammogram for malignant neoplasm of breast: Secondary | ICD-10-CM

## 2022-01-01 ENCOUNTER — Ambulatory Visit: Payer: 59 | Admitting: Internal Medicine

## 2022-01-16 ENCOUNTER — Other Ambulatory Visit: Payer: Self-pay | Admitting: Internal Medicine

## 2022-01-16 DIAGNOSIS — M545 Low back pain, unspecified: Secondary | ICD-10-CM

## 2022-01-16 MED ORDER — CYCLOBENZAPRINE HCL 5 MG PO TABS: 5.0000 mg | ORAL_TABLET | Freq: Three times a day (TID) | ORAL | 0 refills | Status: DC | PRN

## 2022-01-19 ENCOUNTER — Ambulatory Visit: Payer: 59 | Admitting: Internal Medicine

## 2022-01-31 ENCOUNTER — Other Ambulatory Visit: Payer: Self-pay | Admitting: Internal Medicine

## 2022-01-31 ENCOUNTER — Ambulatory Visit (INDEPENDENT_AMBULATORY_CARE_PROVIDER_SITE_OTHER): Payer: 59

## 2022-01-31 ENCOUNTER — Encounter: Payer: Self-pay | Admitting: Internal Medicine

## 2022-01-31 ENCOUNTER — Ambulatory Visit (INDEPENDENT_AMBULATORY_CARE_PROVIDER_SITE_OTHER): Payer: 59 | Admitting: Internal Medicine

## 2022-01-31 VITALS — BP 136/74 | HR 67 | Temp 97.8°F | Ht 62.0 in | Wt 178.0 lb

## 2022-01-31 DIAGNOSIS — Z23 Encounter for immunization: Secondary | ICD-10-CM

## 2022-01-31 DIAGNOSIS — M545 Low back pain, unspecified: Secondary | ICD-10-CM

## 2022-01-31 DIAGNOSIS — M159 Polyosteoarthritis, unspecified: Secondary | ICD-10-CM

## 2022-01-31 DIAGNOSIS — M150 Primary generalized (osteo)arthritis: Secondary | ICD-10-CM | POA: Insufficient documentation

## 2022-01-31 DIAGNOSIS — M25552 Pain in left hip: Secondary | ICD-10-CM

## 2022-01-31 DIAGNOSIS — K580 Irritable bowel syndrome with diarrhea: Secondary | ICD-10-CM

## 2022-01-31 DIAGNOSIS — G8929 Other chronic pain: Secondary | ICD-10-CM | POA: Diagnosis not present

## 2022-01-31 DIAGNOSIS — M25551 Pain in right hip: Secondary | ICD-10-CM

## 2022-01-31 MED ORDER — DICYCLOMINE HCL 10 MG PO CAPS: 10.0000 mg | ORAL_CAPSULE | Freq: Three times a day (TID) | ORAL | 0 refills | Status: DC

## 2022-01-31 MED ORDER — DICLOFENAC SODIUM 25 MG PO TBEC: 25.0000 mg | DELAYED_RELEASE_TABLET | Freq: Three times a day (TID) | ORAL | 0 refills | Status: DC

## 2022-01-31 NOTE — Patient Instructions (Signed)
Osteoarthritis  Osteoarthritis is a type of arthritis. It refers to joint pain or joint disease. Osteoarthritis affects tissue that covers the ends of bones in joints (cartilage). Cartilage acts as a cushion between the bones and helps them move smoothly. Osteoarthritis occurs when cartilage in the joints gets worn down. Osteoarthritis is sometimes called "wear and tear" arthritis. Osteoarthritis is the most common form of arthritis. It often occurs in older people. It is a condition that gets worse over time. The joints most often affected by this condition are in the fingers, toes, hips, knees, and spine, including the neck and lower back. What are the causes? This condition is caused by the wearing down of cartilage that covers the ends of bones. What increases the risk? The following factors may make you more likely to develop this condition: Being age 50 or older. Obesity. Overuse of joints. Past injury of a joint. Past surgery on a joint. Family history of osteoarthritis. What are the signs or symptoms? The main symptoms of this condition are pain, swelling, and stiffness in the joint. Other symptoms may include: An enlarged joint. More pain and further damage caused by small pieces of bone or cartilage that break off and float inside of the joint. Small deposits of bone (osteophytes) that grow on the edges of the joint. A grating or scraping feeling inside the joint when you move it. Popping or creaking sounds when you move. Difficulty walking or exercising. An inability to grip items, twist your hand(s), or control the movements of your hands and fingers. How is this diagnosed? This condition may be diagnosed based on: Your medical history. A physical exam. Your symptoms. X-rays of the affected joint(s). Blood tests to rule out other types of arthritis. How is this treated? There is no cure for this condition, but treatment can help control pain and improve joint function.  Treatment may include a combination of therapies, such as: Pain relief techniques, such as: Applying heat and cold to the joint. Massage. A form of talk therapy called cognitive behavioral therapy (CBT). This therapy helps you set goals and follow up on the changes that you make. Medicines for pain and inflammation. The medicines can be taken by mouth or applied to the skin. They include: NSAIDs, such as ibuprofen. Prescription medicines. Strong anti-inflammatory medicines (corticosteroids). Certain nutritional supplements. A prescribed exercise program. You may work with a physical therapist. Assistive devices, such as a brace, wrap, splint, specialized glove, or cane. A weight control plan. Surgery, such as: An osteotomy. This is done to reposition the bones and relieve pain or to remove loose pieces of bone and cartilage. Joint replacement surgery. You may need this surgery if you have advanced osteoarthritis. Follow these instructions at home: Activity Rest your affected joints as told by your health care provider. Exercise as told by your health care provider. He or she may recommend specific types of exercise, such as: Strengthening exercises. These are done to strengthen the muscles that support joints affected by arthritis. Aerobic activities. These are exercises, such as brisk walking or water aerobics, that increase your heart rate. Range-of-motion activities. These help your joints move more easily. Balance and agility exercises. Managing pain, stiffness, and swelling     If directed, apply heat to the affected area as often as told by your health care provider. Use the heat source that your health care provider recommends, such as a moist heat pack or a heating pad. If you have a removable assistive device, remove it   as told by your health care provider. Place a towel between your skin and the heat source. If your health care provider tells you to keep the assistive device  on while you apply heat, place a towel between the assistive device and the heat source. Leave the heat on for 20-30 minutes. Remove the heat if your skin turns bright red. This is especially important if you are unable to feel pain, heat, or cold. You may have a greater risk of getting burned. If directed, put ice on the affected area. To do this: If you have a removable assistive device, remove it as told by your health care provider. Put ice in a plastic bag. Place a towel between your skin and the bag. If your health care provider tells you to keep the assistive device on during icing, place a towel between the assistive device and the bag. Leave the ice on for 20 minutes, 2-3 times a day. Move your fingers or toes often to reduce stiffness and swelling. Raise (elevate) the injured area above the level of your heart while you are sitting or lying down. General instructions Take over-the-counter and prescription medicines only as told by your health care provider. Maintain a healthy weight. Follow instructions from your health care provider for weight control. Do not use any products that contain nicotine or tobacco, such as cigarettes, e-cigarettes, and chewing tobacco. If you need help quitting, ask your health care provider. Use assistive devices as told by your health care provider. Keep all follow-up visits as told by your health care provider. This is important. Where to find more information National Institute of Arthritis and Musculoskeletal and Skin Diseases: www.niams.nih.gov National Institute on Aging: www.nia.nih.gov American College of Rheumatology: www.rheumatology.org Contact a health care provider if: You have redness, swelling, or a feeling of warmth in a joint that gets worse. You have a fever along with joint or muscle aches. You develop a rash. You have trouble doing your normal activities. Get help right away if: You have pain that gets worse and is not relieved by  pain medicine. Summary Osteoarthritis is a type of arthritis that affects tissue covering the ends of bones in joints (cartilage). This condition is caused by the wearing down of cartilage that covers the ends of bones. The main symptom of this condition is pain, swelling, and stiffness in the joint. There is no cure for this condition, but treatment can help control pain and improve joint function. This information is not intended to replace advice given to you by your health care provider. Make sure you discuss any questions you have with your health care provider. Document Revised: 04/06/2019 Document Reviewed: 04/06/2019 Elsevier Patient Education  2023 [employer]  

## 2022-01-31 NOTE — Progress Notes (Signed)
Subjective:  Patient ID: Theresa Nichols, female    DOB: 03-01-1956  Age: 66 y.o. MRN: [id number]  CC: Osteoarthritis and Back Pain   HPI Theresa Nichols presents for f/up -  She complains of a 18-monthhistory of worsening low back pain and bilateral hip pain.  The hip pain is worse on the left than the right.  She also has pain in her shoulders, knees, and feet.  She has been taking a friend's supply of diclofenac and has gotten a good response.  She also complains of a several month history of abdominal bloating and diarrhea.  She complains of weight gain.  Outpatient Medications Prior to Visit  Medication Sig Dispense Refill   Biotin 1 MG CAPS Take 1,000 mg by mouth daily.     cholecalciferol (VITAMIN D) 1000 UNITS tablet Take 1,000 Units by mouth daily.     diclofenac Sodium (VOLTAREN) 1 % GEL Apply 2 g topically 4 (four) times daily. 350 g 2   fluocinonide cream (LIDEX) 06.14% Apply 1 application topically as needed.     fluticasone (FLONASE) 50 MCG/ACT nasal spray Place 2 sprays into both nostrils daily. 48 g 1   levothyroxine (SYNTHROID) 75 MCG tablet Take 1 tablet (75 mcg total) by mouth daily before breakfast. 90 tablet 1   loratadine (CLARITIN) 10 MG tablet Take 1 tablet (10 mg total) by mouth daily as needed for allergies. 90 tablet 1   Multiple Vitamins-Minerals (MULTIVITAMIN & MINERAL PO) Take by mouth daily.     Rimegepant Sulfate (NURTEC) 75 MG TBDP Take 1 tablet by mouth every other day. 46 tablet 1   valACYclovir (VALTREX) 500 MG tablet Take 1 tablet (500 mg total) by mouth daily. 90 tablet 1   cetirizine (ZYRTEC) 10 MG tablet Take 1 tablet (10 mg total) by mouth daily. 90 tablet 1   cyclobenzaprine (FLEXERIL) 5 MG tablet Take 1 tablet (5 mg total) by mouth 3 (three) times daily as needed for muscle spasms. 270 tablet 0   No facility-administered medications prior to visit.    ROS Review of Systems  Constitutional:  Positive for unexpected weight change.  Negative for appetite change, chills, diaphoresis and fatigue.  HENT: Negative.    Eyes: Negative.   Respiratory:  Negative for chest tightness, shortness of breath and wheezing.   Cardiovascular:  Negative for chest pain, palpitations and leg swelling.  Gastrointestinal:  Positive for diarrhea. Negative for abdominal pain, blood in stool, constipation, nausea and vomiting.  Endocrine: Negative.   Genitourinary: Negative.  Negative for difficulty urinating.  Musculoskeletal:  Positive for arthralgias and back pain. Negative for joint swelling and myalgias.  Neurological:  Negative for dizziness, weakness and headaches.  Hematological:  Negative for adenopathy. Does not bruise/bleed easily.  Psychiatric/Behavioral: Negative.      Objective:  BP 136/74 (BP Location: Left Arm, Patient Position: Sitting, Cuff Size: Large)   Pulse 67   Temp 97.8 F (36.6 C) (Oral)   Ht '5\' 2"'$  (1.575 m)   Wt 178 lb (80.7 kg)   SpO2 93%   BMI 32.56 kg/m   BP Readings from Last 3 Encounters:  01/31/22 136/74  09/25/21 126/72  06/19/21 136/82    Wt Readings from Last 3 Encounters:  01/31/22 178 lb (80.7 kg)  09/25/21 170 lb (77.1 kg)  06/19/21 166 lb (75.3 kg)    Physical Exam Vitals reviewed.  HENT:     Nose: Nose normal.     Mouth/Throat:  Mouth: Mucous membranes are moist.  Eyes:     General: No scleral icterus.    Conjunctiva/sclera: Conjunctivae normal.  Cardiovascular:     Rate and Rhythm: Normal rate and regular rhythm.     Heart sounds: No murmur heard. Pulmonary:     Effort: Pulmonary effort is normal.     Breath sounds: No stridor. No wheezing, rhonchi or rales.  Abdominal:     General: Abdomen is protuberant. Bowel sounds are normal. There is no distension.     Palpations: Abdomen is soft. There is no hepatomegaly, splenomegaly or mass.     Tenderness: There is no abdominal tenderness.  Musculoskeletal:        General: Normal range of motion.     Cervical back: Neck  supple.     Thoracic back: Normal.     Lumbar back: Normal. No bony tenderness. Normal range of motion. Negative right straight leg raise test and negative left straight leg raise test.     Right hip: Normal. No tenderness or bony tenderness. Normal range of motion.     Left hip: Normal. No tenderness or bony tenderness. Normal range of motion.     Right lower leg: No edema.     Left lower leg: No edema.  Lymphadenopathy:     Cervical: No cervical adenopathy.  Skin:    General: Skin is warm and dry.  Neurological:     General: No focal deficit present.     Mental Status: She is alert.  Psychiatric:        Mood and Affect: Mood normal.        Behavior: Behavior normal.     Lab Results  Component Value Date   WBC 5.4 09/27/2021   HGB 13.4 09/27/2021   HCT 40.5 09/27/2021   PLT 293.0 09/27/2021   GLUCOSE 84 09/27/2021   CHOL 202 (H) 09/27/2021   TRIG 82.0 09/27/2021   HDL 55.20 09/27/2021   LDLCALC 130 (H) 09/27/2021   ALT 15 09/27/2021   AST 16 09/27/2021   NA 140 09/27/2021   K 4.6 09/27/2021   CL 105 09/27/2021   CREATININE 0.82 09/27/2021   BUN 26 (H) 09/27/2021   CO2 29 09/27/2021   TSH 3.01 09/27/2021    MM 3D SCREEN BREAST BILATERAL  Result Date: 11/02/2021 CLINICAL DATA:  Screening. EXAM: DIGITAL SCREENING BILATERAL MAMMOGRAM WITH TOMOSYNTHESIS AND CAD TECHNIQUE: Bilateral screening digital craniocaudal and mediolateral oblique mammograms were obtained. Bilateral screening digital breast tomosynthesis was performed. The images were evaluated with computer-aided detection. COMPARISON:  Previous exam(s). ACR Breast Density Category b: There are scattered areas of fibroglandular density. FINDINGS: There are no findings suspicious for malignancy. IMPRESSION: No mammographic evidence of malignancy. A result letter of this screening mammogram will be mailed directly to the patient. RECOMMENDATION: Screening mammogram in one year. (Code:SM-B-01Y) BI-RADS CATEGORY  1:  Negative. Electronically Signed   By: Lajean Manes M.D.   On: 11/02/2021 12:28   DG Lumbar Spine Complete  Result Date: 02/02/2022 CLINICAL DATA:  Low back pain and bilateral hip pain following fall 3 months ago. EXAM: LUMBAR SPINE - COMPLETE 4+ VIEW; DG HIP (WITH OR WITHOUT PELVIS) 5+V BILAT COMPARISON:  06/28/2017. FINDINGS: Lumbar spine: There is no evidence of lumbar spine fracture. Alignment is normal. Multilevel intervertebral disc space narrowing, degenerative endplate changes and facet arthropathy. Bilateral hip and pelvis: No acute fracture or dislocation. Joint spaces maintained at the hips bilaterally. IMPRESSION: 1. No acute fracture in the lumbar spine or  pelvis. 2. Multilevel degenerative changes in the lumbar spine. Electronically Signed   By: Brett Fairy M.D.   On: 02/02/2022 04:31   DG HIPS BILAT WITH PELVIS MIN 5 VIEWS  Result Date: 02/02/2022 CLINICAL DATA:  Low back pain and bilateral hip pain following fall 3 months ago. EXAM: LUMBAR SPINE - COMPLETE 4+ VIEW; DG HIP (WITH OR WITHOUT PELVIS) 5+V BILAT COMPARISON:  06/28/2017. FINDINGS: Lumbar spine: There is no evidence of lumbar spine fracture. Alignment is normal. Multilevel intervertebral disc space narrowing, degenerative endplate changes and facet arthropathy. Bilateral hip and pelvis: No acute fracture or dislocation. Joint spaces maintained at the hips bilaterally. IMPRESSION: 1. No acute fracture in the lumbar spine or pelvis. 2. Multilevel degenerative changes in the lumbar spine. Electronically Signed   By: Brett Fairy M.D.   On: 02/02/2022 04:31      Assessment & Plan:   Theresa Nichols was seen today for osteoarthritis and back pain.  Diagnoses and all orders for this visit:  Chronic bilateral low back pain without sciatica- Plain films are consistent with degenerative disc disease.  There is no evidence of radiculopathy.  Will control the pain with an NSAID and muscle relaxer. -     DG Lumbar Spine Complete;  Future -     diclofenac (VOLTAREN) 25 MG EC tablet; Take 1 tablet (25 mg total) by mouth 3 (three) times daily. -     cyclobenzaprine (FLEXERIL) 5 MG tablet; Take 1 tablet (5 mg total) by mouth 3 (three) times daily as needed for muscle spasms.  Bilateral hip pain -     DG HIPS BILAT WITH PELVIS MIN 5 VIEWS; Future  Primary osteoarthritis involving multiple joints -     diclofenac (VOLTAREN) 25 MG EC tablet; Take 1 tablet (25 mg total) by mouth 3 (three) times daily.  Irritable bowel syndrome with diarrhea -     dicyclomine (BENTYL) 10 MG capsule; Take 1 capsule (10 mg total) by mouth 3 (three) times daily before meals.  Other orders -     Flu Vaccine QUAD 6+ mos PF IM (Fluarix Quad PF)   I have discontinued Theresa Nichols's cetirizine. I am also having her start on diclofenac and dicyclomine. Additionally, I am having her maintain her Multiple Vitamins-Minerals (MULTIVITAMIN & MINERAL PO), Biotin, cholecalciferol, fluocinonide cream, Nurtec, diclofenac Sodium, fluticasone, valACYclovir, loratadine, levothyroxine, and cyclobenzaprine.  Meds ordered this encounter  Medications   diclofenac (VOLTAREN) 25 MG EC tablet    Sig: Take 1 tablet (25 mg total) by mouth 3 (three) times daily.    Dispense:  270 tablet    Refill:  0   dicyclomine (BENTYL) 10 MG capsule    Sig: Take 1 capsule (10 mg total) by mouth 3 (three) times daily before meals.    Dispense:  270 capsule    Refill:  0   cyclobenzaprine (FLEXERIL) 5 MG tablet    Sig: Take 1 tablet (5 mg total) by mouth 3 (three) times daily as needed for muscle spasms.    Dispense:  270 tablet    Refill:  0     Follow-up: Return in about 3 months (around 05/03/2022).  Scarlette Calico, MD

## 2022-02-01 ENCOUNTER — Telehealth: Payer: Self-pay | Admitting: Internal Medicine

## 2022-02-01 MED ORDER — CYCLOBENZAPRINE HCL 5 MG PO TABS: 5.0000 mg | ORAL_TABLET | Freq: Three times a day (TID) | ORAL | 0 refills | Status: DC | PRN

## 2022-02-02 ENCOUNTER — Other Ambulatory Visit: Payer: Self-pay | Admitting: Internal Medicine

## 2022-02-02 DIAGNOSIS — G8929 Other chronic pain: Secondary | ICD-10-CM

## 2022-03-19 ENCOUNTER — Other Ambulatory Visit: Payer: Self-pay | Admitting: Internal Medicine

## 2022-03-19 DIAGNOSIS — J301 Allergic rhinitis due to pollen: Secondary | ICD-10-CM

## 2022-03-23 ENCOUNTER — Telehealth: Payer: Self-pay | Admitting: Internal Medicine

## 2022-03-23 DIAGNOSIS — B002 Herpesviral gingivostomatitis and pharyngotonsillitis: Secondary | ICD-10-CM

## 2022-03-23 NOTE — Telephone Encounter (Signed)
Patient called stating they need a refill of their valacyclovir (valtrex) 500 mg. They are completely out.

## 2022-03-26 MED ORDER — VALACYCLOVIR HCL 500 MG PO TABS: 500.0000 mg | ORAL_TABLET | Freq: Every day | ORAL | 1 refills | Status: DC

## 2022-03-28 ENCOUNTER — Other Ambulatory Visit: Payer: Self-pay | Admitting: Internal Medicine

## 2022-03-28 DIAGNOSIS — E039 Hypothyroidism, unspecified: Secondary | ICD-10-CM

## 2022-04-27 ENCOUNTER — Other Ambulatory Visit: Payer: Self-pay | Admitting: Internal Medicine

## 2022-04-27 ENCOUNTER — Ambulatory Visit: Payer: 59 | Admitting: Internal Medicine

## 2022-04-27 DIAGNOSIS — K580 Irritable bowel syndrome with diarrhea: Secondary | ICD-10-CM

## 2022-05-01 ENCOUNTER — Other Ambulatory Visit: Payer: Self-pay | Admitting: Internal Medicine

## 2022-05-01 DIAGNOSIS — M545 Low back pain, unspecified: Secondary | ICD-10-CM

## 2022-05-01 DIAGNOSIS — M159 Polyosteoarthritis, unspecified: Secondary | ICD-10-CM

## 2022-05-06 ENCOUNTER — Other Ambulatory Visit: Payer: Self-pay | Admitting: Internal Medicine

## 2022-05-06 DIAGNOSIS — E039 Hypothyroidism, unspecified: Secondary | ICD-10-CM

## 2022-05-06 DIAGNOSIS — M159 Polyosteoarthritis, unspecified: Secondary | ICD-10-CM

## 2022-05-06 DIAGNOSIS — G8929 Other chronic pain: Secondary | ICD-10-CM

## 2022-05-07 ENCOUNTER — Encounter: Payer: Self-pay | Admitting: Internal Medicine

## 2022-05-07 ENCOUNTER — Ambulatory Visit (INDEPENDENT_AMBULATORY_CARE_PROVIDER_SITE_OTHER): Payer: 59 | Admitting: Internal Medicine

## 2022-05-07 VITALS — BP 154/82 | HR 74 | Ht 62.0 in | Wt 182.0 lb

## 2022-05-07 DIAGNOSIS — I10 Essential (primary) hypertension: Secondary | ICD-10-CM | POA: Diagnosis not present

## 2022-05-07 DIAGNOSIS — R195 Other fecal abnormalities: Secondary | ICD-10-CM | POA: Diagnosis not present

## 2022-05-07 DIAGNOSIS — E039 Hypothyroidism, unspecified: Secondary | ICD-10-CM

## 2022-05-07 DIAGNOSIS — R0609 Other forms of dyspnea: Secondary | ICD-10-CM | POA: Diagnosis not present

## 2022-05-07 LAB — CBC WITH DIFFERENTIAL/PLATELET
Basophils Absolute: 0.1 10*3/uL (ref 0.0–0.1)
Basophils Relative: 1.2 % (ref 0.0–3.0)
Eosinophils Absolute: 0.3 10*3/uL (ref 0.0–0.7)
Eosinophils Relative: 3.3 % (ref 0.0–5.0)
HCT: 43.1 % (ref 36.0–46.0)
Hemoglobin: 14.2 g/dL (ref 12.0–15.0)
Lymphocytes Relative: 30.3 % (ref 12.0–46.0)
Lymphs Abs: 2.4 10*3/uL (ref 0.7–4.0)
MCHC: 32.8 g/dL (ref 30.0–36.0)
MCV: 90.2 fl (ref 78.0–100.0)
Monocytes Absolute: 0.5 10*3/uL (ref 0.1–1.0)
Monocytes Relative: 6.2 % (ref 3.0–12.0)
Neutro Abs: 4.7 10*3/uL (ref 1.4–7.7)
Neutrophils Relative %: 59 % (ref 43.0–77.0)
Platelets: 386 10*3/uL (ref 150.0–400.0)
RBC: 4.79 Mil/uL (ref 3.87–5.11)
RDW: 14.5 % (ref 11.5–15.5)
WBC: 7.9 10*3/uL (ref 4.0–10.5)

## 2022-05-07 LAB — URINALYSIS, ROUTINE W REFLEX MICROSCOPIC
Bilirubin Urine: NEGATIVE
Hgb urine dipstick: NEGATIVE
Ketones, ur: NEGATIVE
Nitrite: NEGATIVE
RBC / HPF: NONE SEEN (ref 0–?)
Specific Gravity, Urine: 1.015 (ref 1.000–1.030)
Total Protein, Urine: NEGATIVE
Urine Glucose: NEGATIVE
Urobilinogen, UA: 0.2 (ref 0.0–1.0)
pH: 7 (ref 5.0–8.0)

## 2022-05-07 LAB — BASIC METABOLIC PANEL
BUN: 17 mg/dL (ref 6–23)
CO2: 30 mEq/L (ref 19–32)
Calcium: 9.6 mg/dL (ref 8.4–10.5)
Chloride: 102 mEq/L (ref 96–112)
Creatinine, Ser: 0.8 mg/dL (ref 0.40–1.20)
GFR: 77.43 mL/min (ref >60.00)
Glucose, Bld: 89 mg/dL (ref 70–99)
Potassium: 4.1 mEq/L (ref 3.5–5.1)
Sodium: 141 mEq/L (ref 135–145)

## 2022-05-07 LAB — TROPONIN I (HIGH SENSITIVITY): High Sens Troponin I: 3 ng/L (ref 2–17)

## 2022-05-07 LAB — BRAIN NATRIURETIC PEPTIDE: Pro B Natriuretic peptide (BNP): 37 pg/mL (ref 0.0–100.0)

## 2022-05-07 LAB — TSH: TSH: 3.7 u[IU]/mL (ref 0.35–5.50)

## 2022-05-07 MED ORDER — OLMESARTAN MEDOXOMIL 20 MG PO TABS: 20.0000 mg | ORAL_TABLET | Freq: Every day | ORAL | 0 refills | Status: DC

## 2022-05-07 NOTE — Progress Notes (Signed)
Subjective:  Patient ID: Theresa Nichols, female    DOB: [birth year]  Age: 67 y.o. MRN: [id number]  CC: Hypertension and Hypothyroidism   HPI CHEALSEA PASKE presents for f/up -  She complains of a one month history of DOE and fatigue.  Outpatient Medications Prior to Visit  Medication Sig Dispense Refill   Biotin 1 MG CAPS Take 1,000 mg by mouth daily.     cholecalciferol (VITAMIN D) 1000 UNITS tablet Take 1,000 Units by mouth daily.     cyclobenzaprine (FLEXERIL) 5 MG tablet Take 1 tablet (5 mg total) by mouth 3 (three) times daily as needed for muscle spasms. 270 tablet 0   diclofenac Sodium (VOLTAREN) 1 % GEL Apply 2 g topically 4 (four) times daily. 350 g 2   fluocinonide cream (LIDEX) 5.02 % Apply 1 application topically as needed.     fluticasone (FLONASE) 50 MCG/ACT nasal spray Place 2 sprays into both nostrils daily. 48 g 1   levothyroxine (SYNTHROID) 75 MCG tablet TAKE 1 TABLET BY MOUTH ONCE DAILY BEFORE BREAKFAST 90 tablet 0   loratadine (CLARITIN) 10 MG tablet TAKE 1 TABLET BY MOUTH ONCE DAILY AS NEEDED FOR ALLERGIES 90 tablet 0   Multiple Vitamins-Minerals (MULTIVITAMIN & MINERAL PO) Take by mouth daily.     Rimegepant Sulfate (NURTEC) 75 MG TBDP Take 1 tablet by mouth every other day. 46 tablet 1   valACYclovir (VALTREX) 500 MG tablet Take 1 tablet (500 mg total) by mouth daily. 90 tablet 1   diclofenac (VOLTAREN) 25 MG EC tablet TAKE 1 TABLET BY MOUTH THREE TIMES DAILY 270 tablet 0   dicyclomine (BENTYL) 10 MG capsule TAKE 1 CAPSULE BY MOUTH THREE TIMES DAILY BEFORE MEAL(S) 270 capsule 0   No facility-administered medications prior to visit.    ROS Review of Systems  Constitutional:  Positive for fatigue. Negative for appetite change, chills, diaphoresis, fever and unexpected weight change.  HENT: Negative.    Eyes:  Positive for visual disturbance (BV).  Respiratory:  Positive for shortness of breath. Negative for cough, chest tightness and wheezing.    Cardiovascular:  Negative for chest pain, palpitations and leg swelling.  Gastrointestinal:  Negative for abdominal pain, diarrhea, nausea and vomiting.  Genitourinary: Negative.  Negative for difficulty urinating.  Musculoskeletal:  Positive for arthralgias. Negative for back pain and myalgias.  Skin: Negative.   Neurological:  Positive for headaches. Negative for dizziness, weakness and light-headedness.  Hematological:  Negative for adenopathy.  Psychiatric/Behavioral: Negative.      Objective:  BP (!) 154/82 (BP Location: Left Arm, Patient Position: Sitting, Cuff Size: Large)   Pulse 74   Ht '5\' 2"'$  (1.575 m)   Wt 182 lb (82.6 kg)   SpO2 96%   BMI 33.29 kg/m   BP Readings from Last 3 Encounters:  05/11/22 (!) 175/96  05/07/22 (!) 154/82  01/31/22 136/74    Wt Readings from Last 3 Encounters:  05/11/22 183 lb (83 kg)  05/07/22 182 lb (82.6 kg)  01/31/22 178 lb (80.7 kg)    Physical Exam Vitals reviewed.  Constitutional:      Appearance: She is not ill-appearing.  HENT:     Mouth/Throat:     Mouth: Mucous membranes are moist.  Eyes:     General: No scleral icterus.    Conjunctiva/sclera: Conjunctivae normal.  Cardiovascular:     Rate and Rhythm: Normal rate and regular rhythm.     Heart sounds: No murmur heard.    Comments: EKG- NSR,  66 bpm No LVH, Q waves, or ST/T wave changes Pulmonary:     Effort: Pulmonary effort is normal.     Breath sounds: No stridor. No wheezing, rhonchi or rales.  Abdominal:     General: Abdomen is flat.     Palpations: There is no mass.     Tenderness: There is no abdominal tenderness. There is no guarding.     Hernia: No hernia is present.  Musculoskeletal:        General: No swelling.     Cervical back: Neck supple.     Right lower leg: No edema.     Left lower leg: No edema.  Lymphadenopathy:     Cervical: No cervical adenopathy.  Skin:    General: Skin is warm and dry.  Neurological:     General: No focal deficit  present.     Mental Status: She is alert. Mental status is at baseline.  Psychiatric:        Mood and Affect: Mood normal.        Behavior: Behavior normal.     Lab Results  Component Value Date   WBC 7.9 05/07/2022   HGB 14.2 05/07/2022   HCT 43.1 05/07/2022   PLT 386.0 05/07/2022   GLUCOSE 89 05/07/2022   CHOL 202 (H) 09/27/2021   TRIG 82.0 09/27/2021   HDL 55.20 09/27/2021   LDLCALC 130 (H) 09/27/2021   ALT 15 09/27/2021   AST 16 09/27/2021   NA 141 05/07/2022   K 4.1 05/07/2022   CL 102 05/07/2022   CREATININE 0.80 05/07/2022   BUN 17 05/07/2022   CO2 30 05/07/2022   TSH 3.70 05/07/2022    MM 3D SCREEN BREAST BILATERAL  Result Date: 11/02/2021 CLINICAL DATA:  Screening. EXAM: DIGITAL SCREENING BILATERAL MAMMOGRAM WITH TOMOSYNTHESIS AND CAD TECHNIQUE: Bilateral screening digital craniocaudal and mediolateral oblique mammograms were obtained. Bilateral screening digital breast tomosynthesis was performed. The images were evaluated with computer-aided detection. COMPARISON:  Previous exam(s). ACR Breast Density Category b: There are scattered areas of fibroglandular density. FINDINGS: There are no findings suspicious for malignancy. IMPRESSION: No mammographic evidence of malignancy. A result letter of this screening mammogram will be mailed directly to the patient. RECOMMENDATION: Screening mammogram in one year. (Code:SM-B-01Y) BI-RADS CATEGORY  1: Negative. Electronically Signed   By: Lajean Manes M.D.   On: 11/02/2021 12:28    Assessment & Plan:   Tityana was seen today for hypertension and hypothyroidism.  Diagnoses and all orders for this visit:  Hypothyroidism, unspecified type- She is euthyroid. -     TSH; Future -     TSH  DOE (dyspnea on exertion)- Her EKG and labs are reassuring.  Will evaluate for coronary artery disease. -     CBC with Differential/Platelet; Future -     Troponin I (High Sensitivity); Future -     Brain natriuretic peptide; Future -      EKG 12-Lead -     Brain natriuretic peptide -     Troponin I (High Sensitivity) -     CBC with Differential/Platelet -     CT CORONARY MORPH W/CTA COR W/SCORE W/CA W/CM &/OR WO/CM; Future  Primary hypertension- Her blood pressure is not adequately well-controlled and she is symptomatic.  I recommended that she discontinue the systemic NSAIDs and start taking an ARB. -     Basic metabolic panel; Future -     CBC with Differential/Platelet; Future -     TSH; Future -  Urinalysis, Routine w reflex microscopic; Future -     Aldosterone + renin activity w/ ratio; Future -     EKG 12-Lead -     Aldosterone + renin activity w/ ratio -     Urinalysis, Routine w reflex microscopic -     TSH -     CBC with Differential/Platelet -     Basic metabolic panel -     olmesartan (BENICAR) 20 MG tablet; Take 1 tablet (20 mg total) by mouth daily. (Patient not taking: Reported on 05/11/2022)  Positive colorectal cancer screening using Cologuard test -     Ambulatory referral to Gastroenterology   I have discontinued Hoyle Sauer K. Baldassari's dicyclomine and diclofenac. I am also having her start on olmesartan. Additionally, I am having her maintain her Multiple Vitamins-Minerals (MULTIVITAMIN & MINERAL PO), Biotin, cholecalciferol, fluocinonide cream, Nurtec, diclofenac Sodium, fluticasone, cyclobenzaprine, loratadine, valACYclovir, and levothyroxine.  Meds ordered this encounter  Medications   olmesartan (BENICAR) 20 MG tablet    Sig: Take 1 tablet (20 mg total) by mouth daily.    Dispense:  90 tablet    Refill:  0     Follow-up: Return in about 6 weeks (around 06/18/2022).  Scarlette Calico, MD

## 2022-05-07 NOTE — Patient Instructions (Signed)
Hypertension, Adult High blood pressure (hypertension) is when the force of blood pumping through the arteries is too strong. The arteries are the blood vessels that carry blood from the heart throughout the body. Hypertension forces the heart to work harder to pump blood and may cause arteries to become narrow or stiff. Untreated or uncontrolled hypertension can lead to a heart attack, heart failure, a stroke, kidney disease, and other problems. A blood pressure reading consists of a higher number over a lower number. Ideally, your blood pressure should be below 120/80. The first ("top") number is called the systolic pressure. It is a measure of the pressure in your arteries as your heart beats. The second ("bottom") number is called the diastolic pressure. It is a measure of the pressure in your arteries as the heart relaxes. What are the causes? The exact cause of this condition is not known. There are some conditions that result in high blood pressure. What increases the risk? Certain factors may make you more likely to develop high blood pressure. Some of these risk factors are under your control, including: Smoking. Not getting enough exercise or physical activity. Being overweight. Having too much fat, sugar, calories, or salt (sodium) in your diet. Drinking too much alcohol. Other risk factors include: Having a personal history of heart disease, diabetes, high cholesterol, or kidney disease. Stress. Having a family history of high blood pressure and high cholesterol. Having obstructive sleep apnea. Age. The risk increases with age. What are the signs or symptoms? High blood pressure may not cause symptoms. Very high blood pressure (hypertensive crisis) may cause: Headache. Fast or irregular heartbeats (palpitations). Shortness of breath. Nosebleed. Nausea and vomiting. Vision changes. Severe chest pain, dizziness, and seizures. How is this diagnosed? This condition is diagnosed by  measuring your blood pressure while you are seated, with your arm resting on a flat surface, your legs uncrossed, and your feet flat on the floor. The cuff of the blood pressure monitor will be placed directly against the skin of your upper arm at the level of your heart. Blood pressure should be measured at least twice using the same arm. Certain conditions can cause a difference in blood pressure between your right and left arms. If you have a high blood pressure reading during one visit or you have normal blood pressure with other risk factors, you may be asked to: Return on a different day to have your blood pressure checked again. Monitor your blood pressure at home for 1 week or longer. If you are diagnosed with hypertension, you may have other blood or imaging tests to help your health care provider understand your overall risk for other conditions. How is this treated? This condition is treated by making healthy lifestyle changes, such as eating healthy foods, exercising more, and reducing your alcohol intake. You may be referred for counseling on a healthy diet and physical activity. Your health care provider may prescribe medicine if lifestyle changes are not enough to get your blood pressure under control and if: Your systolic blood pressure is above 130. Your diastolic blood pressure is above 80. Your personal target blood pressure may vary depending on your medical conditions, your age, and other factors. Follow these instructions at home: Eating and drinking  Eat a diet that is high in fiber and potassium, and low in sodium, added sugar, and fat. An example of this eating plan is called the DASH diet. DASH stands for Dietary Approaches to Stop Hypertension. To eat this way: Eat   plenty of fresh fruits and vegetables. Try to fill one half of your plate at each meal with fruits and vegetables. Eat whole grains, such as whole-wheat pasta, brown rice, or whole-grain bread. Fill about one  fourth of your plate with whole grains. Eat or drink low-fat dairy products, such as skim milk or low-fat yogurt. Avoid fatty cuts of meat, processed or cured meats, and poultry with skin. Fill about one fourth of your plate with lean proteins, such as fish, chicken without skin, beans, eggs, or tofu. Avoid pre-made and processed foods. These tend to be higher in sodium, added sugar, and fat. Reduce your daily sodium intake. Many people with hypertension should eat less than 1,500 mg of sodium a day. Do not drink alcohol if: Your health care provider tells you not to drink. You are pregnant, may be pregnant, or are planning to become pregnant. If you drink alcohol: Limit how much you have to: 0-1 drink a day for women. 0-2 drinks a day for men. Know how much alcohol is in your drink. In the [place] one drink equals one 12 oz bottle of beer (355 mL), one 5 oz glass of wine (148 mL), or one 1 oz glass of hard liquor (44 mL). Lifestyle  Work with your health care provider to maintain a healthy body weight or to lose weight. Ask what an ideal weight is for you. Get at least 30 minutes of exercise that causes your heart to beat faster (aerobic exercise) most days of the week. Activities may include walking, swimming, or biking. Include exercise to strengthen your muscles (resistance exercise), such as Pilates or lifting weights, as part of your weekly exercise routine. Try to do these types of exercises for 30 minutes at least 3 days a week. Do not use any products that contain nicotine or tobacco. These products include cigarettes, chewing tobacco, and vaping devices, such as e-cigarettes. If you need help quitting, ask your health care provider. Monitor your blood pressure at home as told by your health care provider. Keep all follow-up visits. This is important. Medicines Take over-the-counter and prescription medicines only as told by your health care provider. Follow directions carefully. Blood  pressure medicines must be taken as prescribed. Do not skip doses of blood pressure medicine. Doing this puts you at risk for problems and can make the medicine less effective. Ask your health care provider about side effects or reactions to medicines that you should watch for. Contact a health care provider if you: Think you are having a reaction to a medicine you are taking. Have headaches that keep coming back (recurring). Feel dizzy. Have swelling in your ankles. Have trouble with your vision. Get help right away if you: Develop a severe headache or confusion. Have unusual weakness or numbness. Feel faint. Have severe pain in your chest or abdomen. Vomit repeatedly. Have trouble breathing. These symptoms may be an emergency. Get help right away. Call 911. Do not wait to see if the symptoms will go away. Do not drive yourself to the hospital. Summary Hypertension is when the force of blood pumping through your arteries is too strong. If this condition is not controlled, it may put you at risk for serious complications. Your personal target blood pressure may vary depending on your medical conditions, your age, and other factors. For most people, a normal blood pressure is less than 120/80. Hypertension is treated with lifestyle changes, medicines, or a combination of both. Lifestyle changes include losing weight, eating a healthy,   low-sodium diet, exercising more, and limiting alcohol. This information is not intended to replace advice given to you by your health care provider. Make sure you discuss any questions you have with your health care provider. Document Revised: 02/14/2021 Document Reviewed: 02/14/2021 Elsevier Patient Education  2023 Else[employer] Inc.  

## 2022-05-10 NOTE — Progress Notes (Signed)
Office Visit Note  Patient: Theresa Nichols             Date of Birth: Feb 21, 1956           MRN: [id number]             PCP: Janith Lima, MD Referring: Janith Lima, MD Visit Date: 05/11/2022   Subjective:  New Patient (Initial Visit) (Abnormal labs)   History of Present Illness: Theresa Nichols is a 67 y.o. female here for evaluation of positive ANA associated with joint pain and raynaud's symptom.  Many of her symptoms are fairly chronic without a very specific onset but has had worsening trouble particularly since sustaining joint injury several areas after a bad fall in 2019.  She has longstanding joint pain and decreased range of motion in the right knee after sustaining an injury to this area with what sounds like meniscus repair or debridement and has some known osteoarthritis.  Otherwise she feels some joint and muscle pain and stiffness frequently worst around the shoulders back and hips.  She uses topical diclofenac on affected areas which is partially beneficial and some benefit with muscle relaxant medicine has Flexeril for this.  She is also having pain on the bottom of the right foot that is more recent mostly painful with pressure on this area does not hurt at rest.  She is scheduled to see podiatry next week for further evaluation of this foot problem.  Outside of her joint and muscle pains also has issue with right-sided headache with associated migraine symptoms started after hitting her head with the fall in 2019.  She has poor sleep quality often wakes nightly around 2 or 3:00 with no specific trigger or symptoms that she can associate.  He is also having trouble with Raynaud's symptoms affecting fingers on both hands most commonly pallor but occasionally seeing multiphasic changes with cyanosis is also started within the past few years no history of Raynaud's as a young adult.  He is also noticed some frequent or easy bruising with very minor bumps.  She has dry  eyes and has started using topical ointment for lubrication due to irritation from this.  No new issues with photosensitive rashes, oral ulcers, lymphadenopathy, no history of blood clots.   Labs reviewed 02/2021 ANA 1:80 speckled CCP neg    Activities of Daily Living:  Patient reports morning stiffness for 2 hours.   Patient Reports nocturnal pain.  Difficulty dressing/grooming: Denies Difficulty climbing stairs: Reports Difficulty getting out of chair: Reports Difficulty using hands for taps, buttons, cutlery, and/or writing: Reports  Review of Systems  Constitutional:  Positive for fatigue.  HENT:  Positive for mouth dryness. Negative for mouth sores.   Eyes:  Positive for dryness.  Respiratory:  Positive for shortness of breath.   Cardiovascular:  Negative for chest pain and palpitations.  Gastrointestinal:  Negative for blood in stool, constipation and diarrhea.  Endocrine: Positive for increased urination.  Genitourinary:  Negative for involuntary urination.  Musculoskeletal:  Positive for joint pain, gait problem, joint pain, joint swelling, myalgias, muscle weakness, morning stiffness, muscle tenderness and myalgias.  Skin:  Positive for hair loss. Negative for color change, rash and sensitivity to sunlight.  Allergic/Immunologic: Negative for susceptible to infections.  Neurological:  Positive for dizziness and headaches.  Hematological:  Negative for swollen glands.  Psychiatric/Behavioral:  Positive for sleep disturbance. Negative for depressed mood. The patient is not nervous/anxious.     PMFS History:  Patient  Active Problem List   Diagnosis Date Noted   Primary hypertension 05/07/2022   DOE (dyspnea on exertion) 05/07/2022   Primary osteoarthritis involving multiple joints 01/31/2022   Irritable bowel syndrome with diarrhea 01/31/2022   Seasonal allergic rhinitis due to pollen 09/27/2021   Chronic bilateral low back pain without sciatica 09/27/2021   Primary  osteoarthritis of both knees 09/27/2021   Recurrent oral herpes simplex 09/27/2021   Low vitamin B12 level 09/27/2021   Hyperlipidemia LDL goal <130 09/27/2021   Visit for screening mammogram 09/27/2021   Vitamin D deficiency 09/26/2021   Positive colorectal cancer screening using Cologuard test 04/14/2021   ANA positive 03/20/2021   Hypothyroidism 03/15/2021   Raynaud's phenomenon without gangrene 03/15/2021   RLS (restless legs syndrome) 03/15/2021   Common migraine with intractable migraine 09/27/2020    Past Medical History:  Diagnosis Date   Allergy    seasonal   Anxiety    Arthritis    knees,left hip   Blood transfusion without reported diagnosis    1983   Common migraine with intractable migraine 09/27/2020   Depression    History of concussion 09/27/2020   06/2017   Thyroid disease    hypothyroidism   Vitamin B12 deficiency     Family History  Problem Relation Age of Onset   Diabetes Mother    Heart disease Mother    Diverticulosis Mother    Pancreatitis Mother    Asthma Mother    Heart disease Father    Arthritis Father    Seizures Brother    Kidney disease Brother    Breast cancer Cousin    Colon cancer Neg Hx    Colon polyps Neg Hx    Esophageal cancer Neg Hx    Rectal cancer Neg Hx    Stomach cancer Neg Hx    Past Surgical History:  Procedure Laterality Date   ABDOMINAL HYSTERECTOMY     DILATION AND CURETTAGE OF UTERUS     x3   KNEE SURGERY Right    Social History   Social History Narrative   Lives with grandaughter   Left handed   Drinks 1-2 cups caffeine daily   Immunization History  Administered Date(s) Administered   Influenza,inj,Quad PF,6+ Mos 01/31/2022   Moderna Sars-Covid-2 Vaccination 07/03/2019, 07/31/2019   Td 04/23/2016   Zoster Recombinat (Shingrix) 09/25/2021     Objective: Vital Signs: BP (!) 175/96 (BP Location: Right Arm, Patient Position: Sitting, Cuff Size: Normal)   Pulse 68   Ht 5' 2.5" (1.588 m)   Wt 183 lb  (83 kg)   BMI 32.94 kg/m    Physical Exam Constitutional:      Appearance: She is obese.  HENT:     Mouth/Throat:     Mouth: Mucous membranes are moist.     Pharynx: Oropharynx is clear.  Eyes:     Conjunctiva/sclera: Conjunctivae normal.  Cardiovascular:     Rate and Rhythm: Normal rate and regular rhythm.  Pulmonary:     Effort: Pulmonary effort is normal.     Breath sounds: Normal breath sounds.  Musculoskeletal:     Right lower leg: No edema.     Left lower leg: No edema.  Lymphadenopathy:     Cervical: No cervical adenopathy.  Skin:    General: Skin is warm and dry.     Comments: Normal appearing nailfold capillaroscopy Faint livedo reticularis pattern on flexor side of both forearms, few bruises on dorsal side of right wrist and right antecubital fossa  Neurological:     Mental Status: She is alert.     Motor: No weakness.     Deep Tendon Reflexes: Reflexes normal.  Psychiatric:        Mood and Affect: Mood normal.      Musculoskeletal Exam:  Shoulders full ROM no tenderness or swelling Elbows full ROM no tenderness or swelling Wrists full ROM no tenderness or swelling Fingers full ROM some tenderness on thumb between 1st CMC and MCP joints, no palpable sweling Diffuse paraspinal muscle tenderness to pressure, tenderness over trapezius and neck  muscles, lateral hip on both sides Right knee limited extension ROM with hard end point, no palpable effusion or tenderness Ankles full ROM no tenderness or swelling Right 1st MTP bunion no tenderness or swelling, tenderness on plantar side of right 3rd MTP   Investigation: No additional findings.  Imaging: No results found.  Recent Labs: Lab Results  Component Value Date   WBC 7.9 05/07/2022   HGB 14.2 05/07/2022   PLT 386.0 05/07/2022   NA 141 05/07/2022   K 4.1 05/07/2022   CL 102 05/07/2022   CO2 30 05/07/2022   GLUCOSE 89 05/07/2022   BUN 17 05/07/2022   CREATININE 0.80 05/07/2022   BILITOT 0.6  09/27/2021   ALKPHOS 75 09/27/2021   AST 16 09/27/2021   ALT 15 09/27/2021   PROT 6.4 09/27/2021   ALBUMIN 4.0 09/27/2021   CALCIUM 9.6 05/07/2022    Speciality Comments: No specialty comments available.  Procedures:  No procedures performed Allergies: Maxalt [rizatriptan] and Topamax [topiramate]   Assessment / Plan:     Visit Diagnoses: ANA positive - Plan: Sedimentation rate, Anti-scleroderma antibody, RNP Antibody, Anti-Smith antibody, Sjogrens syndrome-A extractable nuclear antibody, Sjogrens syndrome-B extractable nuclear antibody, Anti-DNA antibody, double-stranded, C3 and C4  Numerous chronic symptoms mostly nonspecific joint pains may be explained by some osteoarthritis and possible myofascial pain component with her back issue but some other skin changes and Raynaud's more unusual.  Checking specific antibody panel as detailed above also sedimentation rate and serum complements.  If specific markers positive low threshold to recommend trial of immunomodulatory treatment possible hydroxychloroquine.  If negative would plan to follow-up still to discuss symptomatic management for more osteoarthritis and Raynaud's symptoms.  Raynaud's phenomenon without gangrene  Reviewed her reported symptoms and some image she is taken of her hands at different points in time but the phone looks consistent for Raynaud's.  New onset at a later age is more commonly secondary issue although no ischemic lesions and no abnormal nailfold capillary appearance on exam today.  With uncontrolled hypertension may be a good candidate for trying addition of calcium channel blocker for this going forwards.  Common migraine with intractable migraine  Headaches appear consistent with migraine unilateral distribution associated photophobia nausea and vomiting and intolerance to loud noise associated.  She is prescribed the Nurtec for this but sounds like not taking very consistently.  Primary osteoarthritis  involving multiple joints  Known right knee osteoarthritis also appears to have some mild degenerative changes in other areas suspect contributing at the base of her thumb joint as well.  Getting partial benefit so far with using mostly topical NSAID.  Orders: Orders Placed This Encounter  Procedures   Sedimentation rate   Anti-scleroderma antibody   RNP Antibody   Anti-Smith antibody   Sjogrens syndrome-A extractable nuclear antibody   Sjogrens syndrome-B extractable nuclear antibody   Anti-DNA antibody, double-stranded   C3 and C4   No orders of the  defined types were placed in this encounter.    Follow-Up Instructions: Return in about 3 weeks (around 06/01/2022) for New pt +ANA/OA/raynaud f/u 3wks.   Collier Salina, MD  Note - This record has been created using [employer].  Chart creation errors have been sought, but may not always  have been located. Such creation errors do not reflect on  the standard of medical care.

## 2022-05-11 ENCOUNTER — Telehealth (HOSPITAL_COMMUNITY): Payer: Self-pay | Admitting: *Deleted

## 2022-05-11 ENCOUNTER — Encounter: Payer: Self-pay | Admitting: Internal Medicine

## 2022-05-11 ENCOUNTER — Ambulatory Visit: Payer: 59 | Attending: Internal Medicine | Admitting: Internal Medicine

## 2022-05-11 ENCOUNTER — Encounter (HOSPITAL_COMMUNITY): Payer: Self-pay

## 2022-05-11 VITALS — BP 175/96 | HR 68 | Ht 62.5 in | Wt 183.0 lb

## 2022-05-11 DIAGNOSIS — G43019 Migraine without aura, intractable, without status migrainosus: Secondary | ICD-10-CM

## 2022-05-11 DIAGNOSIS — R768 Other specified abnormal immunological findings in serum: Secondary | ICD-10-CM | POA: Diagnosis not present

## 2022-05-11 DIAGNOSIS — K580 Irritable bowel syndrome with diarrhea: Secondary | ICD-10-CM

## 2022-05-11 DIAGNOSIS — M159 Polyosteoarthritis, unspecified: Secondary | ICD-10-CM

## 2022-05-11 DIAGNOSIS — I7300 Raynaud's syndrome without gangrene: Secondary | ICD-10-CM | POA: Diagnosis not present

## 2022-05-11 MED ORDER — METOPROLOL TARTRATE 50 MG PO TABS: ORAL_TABLET | ORAL | 0 refills | Status: DC

## 2022-05-11 NOTE — Telephone Encounter (Signed)
Patient returning call regarding upcoming cardiac imaging study; pt verbalizes understanding of appt date/time, parking situation and where to check in, pre-test NPO status and medications ordered, and verified current allergies; name and call back number provided for further questions should they arise  Gordy Clement RN Navigator Cardiac Imaging [facility] [phone] office [phone] cell  Patient to take '50mg'$  metoprolol tartrate two hours prior to her cardiac CT scan. She is aware to arrive at 12:30 PM.

## 2022-05-11 NOTE — Telephone Encounter (Signed)
Attempted to call patient regarding upcoming cardiac CT appointment. Left message on voicemail with name and callback number  Theresa Clement RN Navigator Cardiac Imaging [facility] [phone] Office [phone] Cell  Medication for CCTA sent to patient's pharmacy on file.

## 2022-05-11 NOTE — Patient Instructions (Signed)

## 2022-05-13 LAB — ALDOSTERONE + RENIN ACTIVITY W/ RATIO
Aldosterone: <1 ng/dL
Renin Activity: 0.23 ng/mL/h — ABNORMAL LOW (ref 0.25–5.82)

## 2022-05-14 ENCOUNTER — Ambulatory Visit (INDEPENDENT_AMBULATORY_CARE_PROVIDER_SITE_OTHER): Payer: 59 | Admitting: Podiatry

## 2022-05-14 ENCOUNTER — Ambulatory Visit (HOSPITAL_COMMUNITY)
Admission: RE | Admit: 2022-05-14 | Discharge: 2022-05-14 | Disposition: A | Discharge status: Home / Self Care | Payer: 59 | Source: Ambulatory Visit | Attending: Internal Medicine | Admitting: Internal Medicine

## 2022-05-14 ENCOUNTER — Encounter: Payer: Self-pay | Admitting: Internal Medicine

## 2022-05-14 ENCOUNTER — Ambulatory Visit (INDEPENDENT_AMBULATORY_CARE_PROVIDER_SITE_OTHER): Payer: 59

## 2022-05-14 ENCOUNTER — Encounter: Payer: Self-pay | Admitting: Podiatry

## 2022-05-14 DIAGNOSIS — R0609 Other forms of dyspnea: Secondary | ICD-10-CM | POA: Diagnosis not present

## 2022-05-14 DIAGNOSIS — M79671 Pain in right foot: Secondary | ICD-10-CM

## 2022-05-14 DIAGNOSIS — R079 Chest pain, unspecified: Secondary | ICD-10-CM

## 2022-05-14 DIAGNOSIS — M7751 Other enthesopathy of right foot: Secondary | ICD-10-CM | POA: Diagnosis not present

## 2022-05-14 LAB — ANTI-SMITH ANTIBODY: ENA SM Ab Ser-aCnc: <1 AI

## 2022-05-14 LAB — ANTI-DNA ANTIBODY, DOUBLE-STRANDED: ds DNA Ab: <1 IU/mL

## 2022-05-14 LAB — SJOGRENS SYNDROME-A EXTRACTABLE NUCLEAR ANTIBODY: SSA (Ro) (ENA) Antibody, IgG: <1 AI

## 2022-05-14 LAB — SEDIMENTATION RATE: Sed Rate: 17 mm/h (ref 0–30)

## 2022-05-14 LAB — C3 AND C4
C3 Complement: 174 mg/dL (ref 83–193)
C4 Complement: 29 mg/dL (ref 15–57)

## 2022-05-14 LAB — ANTI-SCLERODERMA ANTIBODY: Scleroderma (Scl-70) (ENA) Antibody, IgG: <1 AI

## 2022-05-14 LAB — SJOGRENS SYNDROME-B EXTRACTABLE NUCLEAR ANTIBODY: SSB (La) (ENA) Antibody, IgG: <1 AI

## 2022-05-14 LAB — RNP ANTIBODY: Ribonucleic Protein(ENA) Antibody, IgG: <1 AI

## 2022-05-14 MED ORDER — IOHEXOL 350 MG/ML SOLN
95.0000 mL | Freq: Once | INTRAVENOUS | Status: AC | PRN
  Administered 2022-05-14: 95 mL via INTRAVENOUS

## 2022-05-14 MED ORDER — NITROGLYCERIN 0.4 MG SL SUBL
SUBLINGUAL_TABLET | SUBLINGUAL | Status: AC
  Filled 2022-05-14: qty 2

## 2022-05-14 MED ORDER — TRIAMCINOLONE ACETONIDE 10 MG/ML IJ SUSP
10.0000 mg | Freq: Once | INTRAMUSCULAR | Status: AC
  Administered 2022-05-14: 10 mg

## 2022-05-14 MED ORDER — NITROGLYCERIN 0.4 MG SL SUBL
0.8000 mg | SUBLINGUAL_TABLET | Freq: Once | SUBLINGUAL | Status: AC
Start: 2022-05-14 — End: 2022-05-14
  Administered 2022-05-14: 0.8 mg via SUBLINGUAL

## 2022-05-14 NOTE — Progress Notes (Signed)
Subjective:   Patient ID: Theresa Nichols, female   DOB: 67 y.o.   MRN: [id number]   HPI Patient presents with a lot of pain in her right foot and states it has been going on for several months.  States it has been aching and she does not remember any form of injury but it is hard to walk on.  Patient does not smoke likes to be active   Review of Systems  All other systems reviewed and are negative.       Objective:  Physical Exam Vitals and nursing note reviewed.  Constitutional:      Appearance: She is well-developed.  Pulmonary:     Effort: Pulmonary effort is normal.  Musculoskeletal:        General: Normal range of motion.  Skin:    General: Skin is warm.  Neurological:     Mental Status: She is alert.     Neurovascular status intact muscle strength found to be adequate range of motion within normal limits with patient found to have quite a bit of discomfort in the third metatarsal phalangeal joint right with fluid buildup within the joint pain with palpation.  Patient has moderate deformity of the second digit right with elevation and states she thinks maybe its gotten a little bit worse.  Good digital perfusion well-oriented x 3     Assessment:  Inflammatory capsulitis third MPJ right mild in the second MPJ right with the possibility for digital placement issue second toe right foot     Plan:  H&P reviewed condition and will get a focus on the third MPJ.  I went ahead today and I did anesthetize 60 mg like Marcaine mixture I exposed the joint and then went ahead and I drained it getting out a small amount of clear fluid injected with 1/4 cc dexamethasone Kenalog and applied thick plantar padding to reduce pressure on the joint surface  X-rays indicate moderate dorsal medial displacement second digit right no indications arthritis stress fracture

## 2022-05-15 ENCOUNTER — Other Ambulatory Visit: Payer: Self-pay | Admitting: Podiatry

## 2022-05-15 DIAGNOSIS — M79671 Pain in right foot: Secondary | ICD-10-CM

## 2022-05-15 DIAGNOSIS — M7751 Other enthesopathy of right foot: Secondary | ICD-10-CM

## 2022-05-16 ENCOUNTER — Other Ambulatory Visit: Payer: Self-pay | Admitting: Internal Medicine

## 2022-05-16 ENCOUNTER — Telehealth: Payer: Self-pay

## 2022-05-16 DIAGNOSIS — J984 Other disorders of lung: Secondary | ICD-10-CM | POA: Insufficient documentation

## 2022-05-16 NOTE — Telephone Encounter (Signed)
Jane with [facility]  called and stated the radiologist is wanting to make the provider aware of the CRITICAL finding in the CT CORONARY MORPH of Multiple peripherally enhancing centrally cavitary pulmonary nodules highly concerning for septic emboli. Neoplasm is less likely. Further clinical evaluation is recommended, and further evaluation with noncontrast chest CT is recommended at this time to better evaluate the full extent of findings in the thorax.

## 2022-05-17 ENCOUNTER — Other Ambulatory Visit: Payer: Self-pay | Admitting: Internal Medicine

## 2022-05-20 ENCOUNTER — Encounter: Payer: Self-pay | Admitting: Internal Medicine

## 2022-05-23 ENCOUNTER — Encounter: Payer: Self-pay | Admitting: Podiatry

## 2022-05-28 NOTE — Telephone Encounter (Signed)
CT has been scheduled 

## 2022-06-01 ENCOUNTER — Ambulatory Visit
Admission: RE | Admit: 2022-06-01 | Discharge: 2022-06-01 | Disposition: A | Discharge status: Home / Self Care | Payer: 59 | Source: Ambulatory Visit | Attending: Internal Medicine | Admitting: Internal Medicine

## 2022-06-01 DIAGNOSIS — J984 Other disorders of lung: Secondary | ICD-10-CM

## 2022-06-03 ENCOUNTER — Other Ambulatory Visit: Payer: Self-pay | Admitting: Internal Medicine

## 2022-06-11 ENCOUNTER — Other Ambulatory Visit: Payer: Self-pay | Admitting: Internal Medicine

## 2022-06-11 DIAGNOSIS — M170 Bilateral primary osteoarthritis of knee: Secondary | ICD-10-CM

## 2022-06-12 NOTE — Progress Notes (Unsigned)
Office Visit Note  Patient: Theresa Nichols             Date of Birth: 1956-03-14           MRN: [id number]             PCP: Janith Lima, MD Referring: Janith Lima, MD Visit Date: 06/13/2022   Subjective:  No chief complaint on file.   History of Present Illness: Theresa Nichols is a 67 y.o. female here for follow up ***   Previous HPI 05/11/22 Theresa Nichols is a 67 y.o. female here for evaluation of positive ANA associated with joint pain and raynaud's symptom.  Many of her symptoms are fairly chronic without a very specific onset but has had worsening trouble particularly since sustaining joint injury several areas after a bad fall in 2019.  She has longstanding joint pain and decreased range of motion in the right knee after sustaining an injury to this area with what sounds like meniscus repair or debridement and has some known osteoarthritis.  Otherwise she feels some joint and muscle pain and stiffness frequently worst around the shoulders back and hips.  She uses topical diclofenac on affected areas which is partially beneficial and some benefit with muscle relaxant medicine has Flexeril for this.  She is also having pain on the bottom of the right foot that is more recent mostly painful with pressure on this area does not hurt at rest.  She is scheduled to see podiatry next week for further evaluation of this foot problem.  Outside of her joint and muscle pains also has issue with right-sided headache with associated migraine symptoms started after hitting her head with the fall in 2019.  She has poor sleep quality often wakes nightly around 2 or 3:00 with no specific trigger or symptoms that she can associate.  He is also having trouble with Raynaud's symptoms affecting fingers on both hands most commonly pallor but occasionally seeing multiphasic changes with cyanosis is also started within the past few years no history of Raynaud's as a young adult.  He is also  noticed some frequent or easy bruising with very minor bumps.  She has dry eyes and has started using topical ointment for lubrication due to irritation from this.  No new issues with photosensitive rashes, oral ulcers, lymphadenopathy, no history of blood clots.     Labs reviewed 02/2021 ANA 1:80 speckled CCP neg   No Rheumatology ROS completed.   PMFS History:  Patient Active Problem List   Diagnosis Date Noted   Cavitary lesion of lung 05/16/2022   Primary hypertension 05/07/2022   DOE (dyspnea on exertion) 05/07/2022   Primary osteoarthritis involving multiple joints 01/31/2022   Irritable bowel syndrome with diarrhea 01/31/2022   Seasonal allergic rhinitis due to pollen 09/27/2021   Chronic bilateral low back pain without sciatica 09/27/2021   Primary osteoarthritis of both knees 09/27/2021   Recurrent oral herpes simplex 09/27/2021   Low vitamin B12 level 09/27/2021   Hyperlipidemia LDL goal <130 09/27/2021   Visit for screening mammogram 09/27/2021   Vitamin D deficiency 09/26/2021   Positive colorectal cancer screening using Cologuard test 04/14/2021   ANA positive 03/20/2021   Hypothyroidism 03/15/2021   Raynaud's phenomenon without gangrene 03/15/2021   RLS (restless legs syndrome) 03/15/2021   Common migraine with intractable migraine 09/27/2020    Past Medical History:  Diagnosis Date   Allergy    seasonal   Anxiety    Arthritis  knees,left hip   Blood transfusion without reported diagnosis    1983   Common migraine with intractable migraine 09/27/2020   Depression    History of concussion 09/27/2020   06/2017   Thyroid disease    hypothyroidism   Vitamin B12 deficiency     Family History  Problem Relation Age of Onset   Diabetes Mother    Heart disease Mother    Diverticulosis Mother    Pancreatitis Mother    Asthma Mother    Heart disease Father    Arthritis Father    Seizures Brother    Kidney disease Brother    Breast cancer Cousin     Colon cancer Neg Hx    Colon polyps Neg Hx    Esophageal cancer Neg Hx    Rectal cancer Neg Hx    Stomach cancer Neg Hx    Past Surgical History:  Procedure Laterality Date   ABDOMINAL HYSTERECTOMY     DILATION AND CURETTAGE OF UTERUS     x3   KNEE SURGERY Right    Social History   Social History Narrative   Lives with grandaughter   Left handed   Drinks 1-2 cups caffeine daily   Immunization History  Administered Date(s) Administered   Influenza,inj,Quad PF,6+ Mos 01/31/2022   Moderna Sars-Covid-2 Vaccination 07/03/2019, 07/31/2019   Td 04/23/2016   Zoster Recombinat (Shingrix) 09/25/2021     Objective: Vital Signs: There were no vitals taken for this visit.   Physical Exam   Musculoskeletal Exam: ***  CDAI Exam: CDAI Score: -- Patient Global: --; Provider Global: -- Swollen: --; Tender: -- Joint Exam 06/13/2022   No joint exam has been documented for this visit   There is currently no information documented on the homunculus. Go to the Rheumatology activity and complete the homunculus joint exam.  Investigation: No additional findings.  Imaging: CT Chest Wo Contrast  Result Date: 06/01/2022 CLINICAL DATA:  Evaluate cavitary nodule seen incidentally on coronary CTA. EXAM: CT CHEST WITHOUT CONTRAST TECHNIQUE: Multidetector CT imaging of the chest was performed following the standard protocol without IV contrast. RADIATION DOSE REDUCTION: This exam was performed according to the departmental dose-optimization program which includes automated exposure control, adjustment of the mA and/or kV according to patient size and/or use of iterative reconstruction technique. COMPARISON:  05/14/2012 coronary CT. 10/15/2013 chest CT angiogram. FINDINGS: Cardiovascular: Normal heart size. No significant pericardial effusion/thickening. Atherosclerotic nonaneurysmal thoracic aorta. Normal caliber pulmonary arteries. Mediastinum/Nodes: No significant thyroid nodules. Unremarkable  esophagus. No pathologically enlarged axillary, mediastinal or hilar lymph nodes, noting limited sensitivity for the detection of hilar adenopathy on this noncontrast study. Lungs/Pleura: No pneumothorax. No pleural effusion. Peripheral basilar nodular focus of consolidation measures 2.6 x 0.8 cm (series 3/image 90), slightly decreased from 2.8 x 1.0 cm on 05/14/2022 CT. Posterior basilar 0.7 cm solid right lower lobe pulmonary nodule (series 3/image 105), decreased from 1.1 cm on 05/14/2022 CT. Peripheral basilar 1.3 cm solid left lower lobe pulmonary nodule (series 3/image 102), slightly decreased from 1.6 cm on 05/14/2022 CT. Central right lower lobe 1.0 cm indistinct solid right lower lobe pulmonary nodule (series 3/image 91), new since 05/14/2022 CT. No additional new significant pulmonary nodules. Upper abdomen: No acute abnormality. Musculoskeletal: No aggressive appearing focal osseous lesions. Mild thoracic spondylosis. IMPRESSION: 1. Previously visualized pulmonary nodules on recent 05/14/2022 coronary CT have decreased in size. One new right lower lobe indistinct solid pulmonary nodule. This waxing and waning behavior over a short interval is more suggestive of  an infectious or inflammatory etiology. Recommend attention on follow-up noncontrast chest CT in 3 months. 2. No thoracic adenopathy. 3. Aortic Atherosclerosis (ICD10-I70.0). Electronically Signed   By: Ilona Sorrel M.D.   On: 06/01/2022 14:00   CT CORONARY MORPH W/CTA COR W/SCORE W/CA W/CM &/OR WO/CM  Addendum Date: 05/16/2022   ADDENDUM REPORT: 05/16/2022 07:02 EXAM: OVER-READ INTERPRETATION  CT CHEST The following report is an over-read performed by radiologist Dr. Rebekah [facility] on 05/16/2022. This over-read does not include interpretation of cardiac or coronary anatomy or pathology. The coronary calcium score and cardiac CTA interpretation by the cardiologist is attached. COMPARISON:  Chest CTA 10/15/2013.  FINDINGS: Multiple peripherally enhancing centrally low-attenuation pulmonary nodules are noted in the visualized lung bases, largest of which is in the periphery of the right lower lobe (axial image 40 of series 13) measuring 1.7 x 1.2 cm. Within the visualized portions of the thorax there are no suspicious appearing pulmonary nodules or masses, there is no acute consolidative airspace disease, no pleural effusions, no pneumothorax and no lymphadenopathy. Small hiatal hernia. Visualized portions of the upper abdomen are unremarkable. There are no aggressive appearing lytic or blastic lesions noted in the visualized portions of the skeleton. IMPRESSION: 1. Multiple peripherally enhancing centrally cavitary pulmonary nodules highly concerning for septic emboli. Neoplasm is less likely. Further clinical evaluation is recommended, and further evaluation with noncontrast chest CT is recommended at this time to better evaluate the full extent of findings in the thorax. 2. Small hiatal hernia. These results will be called to the ordering clinician or representative by the Radiologist Assistant, and communication documented in the PACS or [employer]. Electronically Signed   By: Vinnie Langton M.D.   On: 05/16/2022 07:02   Result Date: 05/16/2022 CLINICAL DATA:  Chest pain EXAM: Cardiac CTA MEDICATIONS: Sub lingual nitro. 67m x 2 TECHNIQUE: The patient was scanned on a Siemens [id number] scanner. Gantry rotation speed was 250 msecs. Collimation was 0.6 mm. A 100 kV prospective scan was triggered in the ascending thoracic aorta at 35-75% of the R-R interval. Average HR during the scan was 60 bpm. The 3D data set was interpreted on a dedicated work station using MPR, MIP and VRT modes. A total of 80cc of contrast was used. FINDINGS: [facility]cardiac: [facility] No LA appendage thrombus. Pulmonary veins drain normally to the left atrium. Calcium Score: 0 Agatston units. Coronary  Arteries: Right dominant with no anomalies LM: No plaque or stenosis. LAD system:  No plaque or stenosis. Circumflex system: No plaque or stenosis. RCA system: Motion artifact mid RCA, but suspect no plaque or stenosis in the RCA system. IMPRESSION: 1. Coronary artery calcium score 0 Agatston units, suggesting low risk for future cardiac events. 2.  No significant coronary disease noted. Dalton MTeaching laboratory technicianElectronically Signed: By: DLoralie ChampagneM.D. On: 05/14/2022 16:53   DG Foot 2 Views Right  Result Date: 05/15/2022 Please see detailed radiograph report in office note.   Recent Labs: Lab Results  Component Value Date   WBC 7.9 05/07/2022   HGB 14.2 05/07/2022   PLT 386.0 05/07/2022   NA 141 05/07/2022   K 4.1 05/07/2022   CL 102 05/07/2022   CO2 30 05/07/2022   GLUCOSE 89 05/07/2022   BUN 17 05/07/2022   CREATININE 0.80 05/07/2022   BILITOT 0.6 09/27/2021   ALKPHOS 75 09/27/2021   AST 16 09/27/2021   ALT 15 09/27/2021   PROT 6.4 09/27/2021   ALBUMIN  4.0 09/27/2021   CALCIUM 9.6 05/07/2022    Speciality Comments: No specialty comments available.  Procedures:  No procedures performed Allergies: Maxalt [rizatriptan] and Topamax [topiramate]   Assessment / Plan:     Visit Diagnoses: No diagnosis found.  ***  Orders: No orders of the defined types were placed in this encounter.  No orders of the defined types were placed in this encounter.    Follow-Up Instructions: No follow-ups on file.   Collier Salina, MD  Note - This record has been created using [employer].  Chart creation errors have been sought, but may not always  have been located. Such creation errors do not reflect on  the standard of medical care.

## 2022-06-13 ENCOUNTER — Ambulatory Visit: Payer: 59 | Attending: Internal Medicine | Admitting: Internal Medicine

## 2022-06-13 ENCOUNTER — Encounter: Payer: Self-pay | Admitting: Internal Medicine

## 2022-06-13 VITALS — BP 128/76 | HR 65 | Resp 12 | Ht 63.0 in | Wt 182.0 lb

## 2022-06-13 DIAGNOSIS — I7300 Raynaud's syndrome without gangrene: Secondary | ICD-10-CM

## 2022-06-13 DIAGNOSIS — I10 Essential (primary) hypertension: Secondary | ICD-10-CM | POA: Diagnosis not present

## 2022-06-13 DIAGNOSIS — M159 Polyosteoarthritis, unspecified: Secondary | ICD-10-CM

## 2022-06-13 MED ORDER — AMLODIPINE BESYLATE 5 MG PO TABS: 5.0000 mg | ORAL_TABLET | Freq: Every day | ORAL | 2 refills | Status: DC

## 2022-06-21 ENCOUNTER — Ambulatory Visit: Payer: 59 | Admitting: Internal Medicine

## 2022-06-21 ENCOUNTER — Telehealth: Payer: Self-pay | Admitting: Internal Medicine

## 2022-06-21 NOTE — Telephone Encounter (Signed)
Pt was given a new RX, amLODipine (NORVASC ,  from her Rheumatologist and would like her PCP's opinion on taking this medication full time. Pt states her px isn't that bad in spring and summer, but will get worse when the weather starts to get cold again.   Please call pt and advise:  [phone]

## 2022-06-25 ENCOUNTER — Other Ambulatory Visit: Payer: Self-pay | Admitting: Internal Medicine

## 2022-06-25 DIAGNOSIS — E039 Hypothyroidism, unspecified: Secondary | ICD-10-CM

## 2022-07-06 ENCOUNTER — Encounter: Payer: Self-pay | Admitting: Gastroenterology

## 2022-07-06 ENCOUNTER — Ambulatory Visit (INDEPENDENT_AMBULATORY_CARE_PROVIDER_SITE_OTHER): Payer: 59 | Admitting: Gastroenterology

## 2022-07-06 ENCOUNTER — Encounter: Payer: Self-pay | Admitting: Internal Medicine

## 2022-07-06 VITALS — BP 138/60 | HR 86 | Ht 62.0 in | Wt 183.0 lb

## 2022-07-06 DIAGNOSIS — R140 Abdominal distension (gaseous): Secondary | ICD-10-CM

## 2022-07-06 DIAGNOSIS — R195 Other fecal abnormalities: Secondary | ICD-10-CM | POA: Diagnosis not present

## 2022-07-06 MED ORDER — PANTOPRAZOLE SODIUM 40 MG PO TBEC: 40.0000 mg | DELAYED_RELEASE_TABLET | Freq: Every day | ORAL | 3 refills | Status: DC

## 2022-07-06 MED ORDER — NA SULFATE-K SULFATE-MG SULF 17.5-3.13-1.6 GM/177ML PO SOLN: 1.0000 | ORAL | 0 refills | Status: DC

## 2022-07-06 NOTE — Progress Notes (Signed)
Referring Provider: Janith Lima, MD Primary Care Physician:  Janith Lima, MD   Reason for Consultation:  Positive Cologuard   IMPRESSION:  New onset dysphonia not responding to Tums or Nexium. Suspected LPR. EGD recommended.   Positive Cologuard 04/05/21. Colonoscopy recommended.   Postprandial bloating and gas     - ? Reflux, food intolerance, SIBO, Chronic bloating and distension without alarm features, carbohydrate maldigestion, GI dysmotility including gastroparesis, hypothyroidism and body mechanics, IBS   No prior colonoscopy  PLAN: Start pantoprazole 40 mg QAM for suspected reflux EGD with esophageal, gastric, and duodenal biopsies Colonoscopy   HPI: Theresa Nichols is a 67 y.o. female referred by Dr. Ronnald Ramp after positive Cologuard.  The history is obtained through the patient and review of her electronic health record.  Iron 68, ferritin 22.2 03/15/2021 Cologuard positive 04/05/2021 Normal CMP and CBC 09/27/2021  Notes gas and bloating with fruits, vegetables, or dairy. Frequent voice clearing that has her concerned that she may have indigestion. No response to TUMS or Nexium.  No dysphagia, odynophagia, heartburn, nausea, abdominal pain, change in bowel habits, blood in the stool, mucus in the stool.  Mother had perforated diverticulitis. There is no known family history of colon cancer or polyps. No family history of stomach cancer or other GI malignancy. No family history of inflammatory bowel disease or celiac.   No prior endoscopic evaluation.  No prior abdominal imaging. CT of the chest without contrast 05/2022 showed pulmonary nodules that have decreased in size compared to prior imaging and a new right lower lobe pulmonary nodule.  Past Medical History:  Diagnosis Date   Allergy    seasonal   Anxiety    Arthritis    knees,left hip   Blood transfusion without reported diagnosis    1983   Common migraine with intractable migraine 09/27/2020    Depression    History of concussion 09/27/2020   06/2017   Thyroid disease    hypothyroidism   Vitamin B12 deficiency        Current Outpatient Medications  Medication Sig Dispense Refill   amLODipine (NORVASC) 5 MG tablet Take 1 tablet (5 mg total) by mouth daily. 30 tablet 2   Biotin 1 MG CAPS Take 1,000 mg by mouth daily.     cholecalciferol (VITAMIN D) 1000 UNITS tablet Take 1,000 Units by mouth daily.     cyclobenzaprine (FLEXERIL) 5 MG tablet Take 1 tablet (5 mg total) by mouth 3 (three) times daily as needed for muscle spasms. 270 tablet 0   diclofenac Sodium (VOLTAREN) 1 % GEL APPLY 2 GRAMS TOPICALLY 4 TIMES DAILY 300 g 0   fluocinonide cream (LIDEX) [id number] % Apply 1 application topically as needed.     fluticasone (FLONASE) 50 MCG/ACT nasal spray Place 2 sprays into both nostrils daily. 48 g 1   levothyroxine (SYNTHROID) 75 MCG tablet TAKE 1 TABLET BY MOUTH ONCE DAILY BEFORE BREAKFAST 90 tablet 0   loratadine (CLARITIN) 10 MG tablet TAKE 1 TABLET BY MOUTH ONCE DAILY AS NEEDED FOR ALLERGIES 90 tablet 0   Multiple Vitamins-Minerals (MULTIVITAMIN & MINERAL PO) Take by mouth daily.     Multiple Vitamins-Minerals (ZINC PO) Take by mouth.     Na Sulfate-K Sulfate-Mg Sulf (SUPREP BOWEL PREP KIT) 17.5-3.13-1.6 GM/177ML SOLN Take 1 kit by mouth as directed. For colonoscopy prep 354 mL 0   olmesartan (BENICAR) 20 MG tablet Take 1 tablet (20 mg total) by mouth daily. 90 tablet 0   pantoprazole (PROTONIX)  40 MG tablet Take 1 tablet (40 mg total) by mouth daily. 90 tablet 3   Turmeric (QC TUMERIC COMPLEX) 500 MG CAPS Take by mouth.     valACYclovir (VALTREX) 500 MG tablet Take 1 tablet (500 mg total) by mouth daily. 90 tablet 1   No current facility-administered medications for this visit.    Allergies as of 07/06/2022 - Review Complete 07/06/2022  Allergen Reaction Noted   Maxalt [rizatriptan] Other (See Comments) 06/19/2021   Topamax [topiramate] Other (See Comments) 06/19/2021     Family History  Problem Relation Age of Onset   Diabetes Mother    Heart disease Mother    Diverticulosis Mother    Pancreatitis Mother    Asthma Mother    Heart disease Father    Arthritis Father    Seizures Brother    Kidney disease Brother    Breast cancer Cousin    Colon cancer Neg Hx    Colon polyps Neg Hx    Esophageal cancer Neg Hx    Rectal cancer Neg Hx    Stomach cancer Neg Hx     Physical Exam: General:   Alert,  well-nourished, pleasant and cooperative in NAD Head:  Normocephalic and atraumatic. Eyes:  Sclera clear, no icterus.   Conjunctiva pink. Ears:  Normal auditory acuity. Nose:  No deformity, discharge,  or lesions. Mouth:  No deformity or lesions.   Neck:  Supple; no masses or thyromegaly. Lungs:  Clear throughout to auscultation.   No wheezes. Heart:  Regular rate and rhythm; no murmurs. Abdomen:  Soft, nontender, nondistended, normal bowel sounds, no rebound or guarding. No hepatosplenomegaly.   Rectal:  Deferred  Msk:  Symmetrical. No boney deformities LAD: No inguinal or umbilical LAD Extremities:  No clubbing or edema. Neurologic:  Alert and  oriented x4;  grossly nonfocal Skin:  Intact without significant lesions or rashes. Psych:  Alert and cooperative. Normal mood and affect.  I spent 35 minutes, including in depth chart review, independent review of results, communicating results with the patient directly, face-to-face time with the patient, coordinating care, ordering studies and medications as appropriate, and documentation.    Fletcher Ostermiller L. Tarri Glenn, MD, MPH 07/06/2022, 12:14 PM

## 2022-07-06 NOTE — Patient Instructions (Signed)
It was my pleasure to provide care to you today. Based on our discussion, I am providing you with my recommendations below:  RECOMMENDATION(S): You have been scheduled for a colonoscopy. Please follow written instructions given to you at your visit today.  Please pick up your prep supplies at the pharmacy within the next 1-3 days. If you use inhalers (even only as needed), please bring them with you on the day of your procedure.  We have sent the following medications to your pharmacy for you to pick up at your convenience: Suprep, Pantoprazole   Start Pantoprazole - Take 1 pill by mouth once daily.    FOLLOW UP:  After your procedure, you will receive a call from my office staff regarding my recommendation for follow up.  BMI:  _______________________________________________________  If your blood pressure at your visit was 140/90 or greater, please contact your primary care physician to follow up on this.  _______________________________________________________  If you are age 26 or older, your body mass index should be between 23-30. Your Body mass index is 33.47 kg/m. If this is out of the aforementioned range listed, please consider follow up with your Primary Care Provider.  If you are age 22 or younger, your body mass index should be between 19-25. Your Body mass index is 33.47 kg/m. If this is out of the aformentioned range listed, please consider follow up with your Primary Care Provider.   MY CHART:  The [facility] providers would like to encourage you to use [facility] to communicate with providers for non-urgent requests or questions.  Due to long hold times on the telephone, sending your provider a message by [facility] may be a faster and more efficient way to get a response.  Please allow 48 business hours for a response.  Please remember that this is for non-urgent requests.   Thank you for trusting me with your gastrointestinal care!    Thornton Park, MD, MPH

## 2022-07-12 ENCOUNTER — Other Ambulatory Visit: Payer: Self-pay | Admitting: Internal Medicine

## 2022-07-12 DIAGNOSIS — J301 Allergic rhinitis due to pollen: Secondary | ICD-10-CM

## 2022-07-13 ENCOUNTER — Ambulatory Visit
Admission: RE | Admit: 2022-07-13 | Discharge: 2022-07-13 | Disposition: A | Discharge status: Home / Self Care | Payer: 59 | Source: Ambulatory Visit | Attending: Urgent Care | Admitting: Urgent Care

## 2022-07-13 VITALS — BP 135/79 | HR 71 | Temp 98.1°F | Resp 18

## 2022-07-13 DIAGNOSIS — Z79899 Other long term (current) drug therapy: Secondary | ICD-10-CM | POA: Insufficient documentation

## 2022-07-13 DIAGNOSIS — J069 Acute upper respiratory infection, unspecified: Secondary | ICD-10-CM | POA: Insufficient documentation

## 2022-07-13 DIAGNOSIS — R5381 Other malaise: Secondary | ICD-10-CM | POA: Diagnosis not present

## 2022-07-13 DIAGNOSIS — H6991 Unspecified Eustachian tube disorder, right ear: Secondary | ICD-10-CM | POA: Insufficient documentation

## 2022-07-13 DIAGNOSIS — Z791 Long term (current) use of non-steroidal anti-inflammatories (NSAID): Secondary | ICD-10-CM | POA: Diagnosis not present

## 2022-07-13 DIAGNOSIS — Z79890 Hormone replacement therapy (postmenopausal): Secondary | ICD-10-CM | POA: Diagnosis not present

## 2022-07-13 DIAGNOSIS — B349 Viral infection, unspecified: Secondary | ICD-10-CM | POA: Diagnosis present

## 2022-07-13 DIAGNOSIS — J309 Allergic rhinitis, unspecified: Secondary | ICD-10-CM | POA: Diagnosis not present

## 2022-07-13 DIAGNOSIS — Z1152 Encounter for screening for COVID-19: Secondary | ICD-10-CM | POA: Diagnosis not present

## 2022-07-13 DIAGNOSIS — Z87891 Personal history of nicotine dependence: Secondary | ICD-10-CM | POA: Insufficient documentation

## 2022-07-13 DIAGNOSIS — Z79624 Long term (current) use of inhibitors of nucleotide synthesis: Secondary | ICD-10-CM | POA: Diagnosis not present

## 2022-07-13 DIAGNOSIS — R070 Pain in throat: Secondary | ICD-10-CM | POA: Diagnosis present

## 2022-07-13 LAB — POCT RAPID STREP A (OFFICE): Rapid Strep A Screen: NEGATIVE

## 2022-07-13 LAB — POCT INFLUENZA A/B
Influenza A, POC: NEGATIVE
Influenza B, POC: NEGATIVE

## 2022-07-13 MED ORDER — PROMETHAZINE-DM 6.25-15 MG/5ML PO SYRP: 5.0000 mL | ORAL_SOLUTION | Freq: Three times a day (TID) | ORAL | 0 refills | Status: DC | PRN

## 2022-07-13 MED ORDER — PREDNISONE 20 MG PO TABS: 40.0000 mg | ORAL_TABLET | Freq: Every day | ORAL | 0 refills | Status: DC

## 2022-07-13 MED ORDER — LORATADINE 10 MG PO TABS: 10.0000 mg | ORAL_TABLET | Freq: Every day | ORAL | 1 refills | Status: DC | PRN

## 2022-07-13 NOTE — ED Triage Notes (Signed)
Bilateral ear pain, headache, sore throat, right side of ear and throat hurts worse since yesterday.  Cough last night with nasal drainage.   Green nasal drainage.  Body aches.

## 2022-07-13 NOTE — ED Provider Notes (Signed)
[facility]  Note:  This document was prepared using Systems analyst and may include unintentional dictation errors.  MRN: [id number] DOB: 05-26-1955  Subjective:   Theresa Nichols is a 67 y.o. female presenting for 1 day history of acute onset right ear pain, bilateral ear fullness, sinus headaches and drainage.  Started having body pains today.  No chest pain, wheezing, fevers.  Patient would like to be checked for strep and COVID.  No history of asthma.  No smoking.  Has significant allergic rhinitis.  No current facility-administered medications for this encounter.  Current Outpatient Medications:    amLODipine (NORVASC) 5 MG tablet, Take 1 tablet (5 mg total) by mouth daily., Disp: 30 tablet, Rfl: 2   Biotin 1 MG CAPS, Take 1,000 mg by mouth daily., Disp: , Rfl:    cholecalciferol (VITAMIN D) 1000 UNITS tablet, Take 1,000 Units by mouth daily., Disp: , Rfl:    cyclobenzaprine (FLEXERIL) 5 MG tablet, Take 1 tablet (5 mg total) by mouth 3 (three) times daily as needed for muscle spasms., Disp: 270 tablet, Rfl: 0   diclofenac Sodium (VOLTAREN) 1 % GEL, APPLY 2 GRAMS TOPICALLY 4 TIMES DAILY, Disp: 300 g, Rfl: 0   fluocinonide cream (LIDEX) AB-123456789 %, Apply 1 application topically as needed., Disp: , Rfl:    fluticasone (FLONASE) 50 MCG/ACT nasal spray, Place 2 sprays into both nostrils daily., Disp: 48 g, Rfl: 1   levothyroxine (SYNTHROID) 75 MCG tablet, TAKE 1 TABLET BY MOUTH ONCE DAILY BEFORE BREAKFAST, Disp: 90 tablet, Rfl: 0   loratadine (CLARITIN) 10 MG tablet, Take 1 tablet (10 mg total) by mouth daily as needed for allergies., Disp: 90 tablet, Rfl: 1   Multiple Vitamins-Minerals (MULTIVITAMIN & MINERAL PO), Take by mouth daily., Disp: , Rfl:    Multiple Vitamins-Minerals (ZINC PO), Take by mouth., Disp: , Rfl:    Na Sulfate-K Sulfate-Mg Sulf (SUPREP BOWEL PREP KIT) 17.5-3.13-1.6 GM/177ML SOLN, Take 1 kit by mouth as directed. For  colonoscopy prep, Disp: 354 mL, Rfl: 0   olmesartan (BENICAR) 20 MG tablet, Take 1 tablet (20 mg total) by mouth daily., Disp: 90 tablet, Rfl: 0   pantoprazole (PROTONIX) 40 MG tablet, Take 1 tablet (40 mg total) by mouth daily., Disp: 90 tablet, Rfl: 3   Turmeric (QC TUMERIC COMPLEX) 500 MG CAPS, Take by mouth., Disp: , Rfl:    valACYclovir (VALTREX) 500 MG tablet, Take 1 tablet (500 mg total) by mouth daily., Disp: 90 tablet, Rfl: 1   Allergies  Allergen Reactions   Maxalt [Rizatriptan] Other (See Comments)    Blurred vision   Topamax [Topiramate] Other (See Comments)    Blurred vision    Past Medical History:  Diagnosis Date   Allergy    seasonal   Anxiety    Arthritis    knees,left hip   Blood transfusion without reported diagnosis    1983   Common migraine with intractable migraine 09/27/2020   Depression    History of concussion 09/27/2020   06/2017   Thyroid disease    hypothyroidism   Vitamin B12 deficiency      Past Surgical History:  Procedure Laterality Date   ABDOMINAL HYSTERECTOMY     DILATION AND CURETTAGE OF UTERUS     x3   KNEE SURGERY Right     Family History  Problem Relation Age of Onset   Diabetes Mother    Heart disease Mother    Diverticulosis Mother  Pancreatitis Mother    Asthma Mother    Heart disease Father    Arthritis Father    Seizures Brother    Kidney disease Brother    Breast cancer Cousin    Colon cancer Neg Hx    Colon polyps Neg Hx    Esophageal cancer Neg Hx    Rectal cancer Neg Hx    Stomach cancer Neg Hx     Social History   Tobacco Use   Smoking status: Former    Packs/day: 0.50    Years: 15.00    Additional pack years: 0.00    Total pack years: 7.50    Types: Cigarettes    Quit date: 1989    Years since quitting: 35.2    Passive exposure: Never   Smokeless tobacco: Never  Vaping Use   Vaping Use: Never used  Substance Use Topics   Alcohol use: Yes    Comment: 1-2  times per month   Drug use: Never     ROS   Objective:   Vitals: BP 135/79 (BP Location: Right Arm)   Pulse 71   Temp 98.1 F (36.7 C) (Oral)   Resp 18   SpO2 98%   Physical Exam Constitutional:      General: She is not in acute distress.    Appearance: Normal appearance. She is well-developed and normal weight. She is not ill-appearing, toxic-appearing or diaphoretic.  HENT:     Head: Normocephalic and atraumatic.     Right Ear: Tympanic membrane, ear canal and external ear normal. No drainage or tenderness. No middle ear effusion. There is no impacted cerumen. Tympanic membrane is not erythematous or bulging.     Left Ear: Tympanic membrane, ear canal and external ear normal. No drainage or tenderness.  No middle ear effusion. There is no impacted cerumen. Tympanic membrane is not erythematous or bulging.     Nose: Congestion present. No rhinorrhea.     Mouth/Throat:     Mouth: Mucous membranes are moist. No oral lesions.     Pharynx: No pharyngeal swelling, oropharyngeal exudate, posterior oropharyngeal erythema or uvula swelling.     Tonsils: No tonsillar exudate or tonsillar abscesses.     Comments: Streaks postnasal drainage overlying pharynx. Eyes:     General: No scleral icterus.       Right eye: No discharge.        Left eye: No discharge.     Extraocular Movements: Extraocular movements intact.     Right eye: Normal extraocular motion.     Left eye: Normal extraocular motion.     Conjunctiva/sclera: Conjunctivae normal.  Cardiovascular:     Rate and Rhythm: Normal rate and regular rhythm.     Heart sounds: Normal heart sounds. No murmur heard.    No friction rub. No gallop.  Pulmonary:     Effort: Pulmonary effort is normal. No respiratory distress.     Breath sounds: No stridor. No wheezing, rhonchi or rales.  Chest:     Chest wall: No tenderness.  Musculoskeletal:     Cervical back: Normal range of motion and neck supple.  Lymphadenopathy:     Cervical: No cervical adenopathy.  Skin:     General: Skin is warm and dry.  Neurological:     General: No focal deficit present.     Mental Status: She is alert and oriented to person, place, and time.  Psychiatric:        Mood and Affect: Mood normal.  Behavior: Behavior normal.    Results for orders placed or performed during the hospital encounter of 07/13/22 (from the past 24 hour(s))  POCT rapid strep A     Status: None   Collection Time: 07/13/22 11:36 AM  Result Value Ref Range   Rapid Strep A Screen Negative Negative   Point of care influenza was negative.   Assessment and Plan :   PDMP not reviewed this encounter.  1. Acute viral syndrome   2. Throat pain   3. Eustachian tube dysfunction, right   4. Allergic rhinitis, unspecified seasonality, unspecified trigger     Discussed antibiotic stewardship and ultimately patient was agreeable to this.  Given her significant malaise and history of difficult to control allergic rhinitis, offered an oral prednisone course.  Otherwise, will manage for viral illness such as viral URI, viral syndrome, viral rhinitis, COVID-19, viral pharyngitis. Recommended supportive care. Offered scripts for symptomatic relief. COVID 19 and strep culture are pending. Counseled patient on potential for adverse effects with medications prescribed/recommended today, ER and return-to-clinic precautions discussed, patient verbalized understanding.     Jaynee Eagles, [place] 07/13/22 1202

## 2022-07-13 NOTE — Discharge Instructions (Addendum)
We will notify you of your test results as they arrive and may take between about 24 hours.  I encourage you to sign up for MyChart if you have not already done so as this can be the easiest way for [place] to communicate results to you online or through a phone app.  Generally, we only contact you if it is a positive test result.  In the meantime, if you develop worsening symptoms including fever, chest pain, shortness of breath despite our current treatment plan then please report to the emergency room as this may be a sign of worsening status from possible viral infection.  Otherwise, we will manage this as a viral syndrome. For sore throat or cough try using a honey-based tea. Use 3 teaspoons of honey with juice squeezed from half lemon. Place shaved pieces of ginger into 1/2-1 cup of water and warm over stove top. Then mix the ingredients and repeat every 4 hours as needed. Please take Tylenol 500mg -650mg  every 6 hours for aches and pains, fevers. Hydrate very well with at least 2 liters of water. Eat light meals such as soups to replenish electrolytes and soft fruits, veggies. Start an antihistamine like Claritin (10mg  daily) for postnasal drainage, sinus congestion.  You can take this together with prednisone.  Use the cough medications as needed.

## 2022-07-14 LAB — SARS CORONAVIRUS 2 (TAT 6-24 HRS): SARS Coronavirus 2: NEGATIVE

## 2022-07-16 LAB — CULTURE, GROUP A STREP (THRC)

## 2022-07-23 ENCOUNTER — Ambulatory Visit (INDEPENDENT_AMBULATORY_CARE_PROVIDER_SITE_OTHER): Payer: 59 | Admitting: Internal Medicine

## 2022-07-23 ENCOUNTER — Encounter: Payer: Self-pay | Admitting: Internal Medicine

## 2022-07-23 ENCOUNTER — Other Ambulatory Visit: Payer: Self-pay | Admitting: Internal Medicine

## 2022-07-23 VITALS — BP 144/88 | HR 63 | Temp 97.8°F | Resp 16 | Ht 62.0 in | Wt 181.0 lb

## 2022-07-23 DIAGNOSIS — Z23 Encounter for immunization: Secondary | ICD-10-CM | POA: Diagnosis not present

## 2022-07-23 DIAGNOSIS — M545 Low back pain, unspecified: Secondary | ICD-10-CM | POA: Diagnosis not present

## 2022-07-23 DIAGNOSIS — E039 Hypothyroidism, unspecified: Secondary | ICD-10-CM

## 2022-07-23 DIAGNOSIS — G2581 Restless legs syndrome: Secondary | ICD-10-CM | POA: Insufficient documentation

## 2022-07-23 DIAGNOSIS — G8929 Other chronic pain: Secondary | ICD-10-CM

## 2022-07-23 DIAGNOSIS — M170 Bilateral primary osteoarthritis of knee: Secondary | ICD-10-CM

## 2022-07-23 DIAGNOSIS — J301 Allergic rhinitis due to pollen: Secondary | ICD-10-CM

## 2022-07-23 DIAGNOSIS — R7989 Other specified abnormal findings of blood chemistry: Secondary | ICD-10-CM

## 2022-07-23 LAB — CBC WITH DIFFERENTIAL/PLATELET
Basophils Absolute: 0.1 10*3/uL (ref 0.0–0.1)
Basophils Relative: 0.8 % (ref 0.0–3.0)
Eosinophils Absolute: 0.2 10*3/uL (ref 0.0–0.7)
Eosinophils Relative: 2.4 % (ref 0.0–5.0)
HCT: 43.2 % (ref 36.0–46.0)
Hemoglobin: 14.4 g/dL (ref 12.0–15.0)
Lymphocytes Relative: 30.8 % (ref 12.0–46.0)
Lymphs Abs: 2 10*3/uL (ref 0.7–4.0)
MCHC: 33.3 g/dL (ref 30.0–36.0)
MCV: 90.3 fl (ref 78.0–100.0)
Monocytes Absolute: 0.6 10*3/uL (ref 0.1–1.0)
Monocytes Relative: 8.3 % (ref 3.0–12.0)
Neutro Abs: 3.8 10*3/uL (ref 1.4–7.7)
Neutrophils Relative %: 57.7 % (ref 43.0–77.0)
Platelets: 346 10*3/uL (ref 150.0–400.0)
RBC: 4.79 Mil/uL (ref 3.87–5.11)
RDW: 14.6 % (ref 11.5–15.5)
WBC: 6.6 10*3/uL (ref 4.0–10.5)

## 2022-07-23 LAB — IBC + FERRITIN
Ferritin: 44.8 ng/mL (ref 10.0–291.0)
Iron: 102 ug/dL (ref 42–145)
Saturation Ratios: 34.4 % (ref 20.0–50.0)
TIBC: 296.8 ug/dL (ref 250.0–450.0)
Transferrin: 212 mg/dL (ref 212.0–360.0)

## 2022-07-23 LAB — TSH: TSH: 1.04 u[IU]/mL (ref 0.35–5.50)

## 2022-07-23 MED ORDER — DICLOFENAC SODIUM 1 % EX GEL: 2.0000 g | Freq: Four times a day (QID) | CUTANEOUS | 1 refills | Status: AC

## 2022-07-23 MED ORDER — FLUTICASONE PROPIONATE 50 MCG/ACT NA SUSP: 2.0000 | Freq: Every day | NASAL | 1 refills | Status: AC

## 2022-07-23 MED ORDER — CYCLOBENZAPRINE HCL 5 MG PO TABS: 5.0000 mg | ORAL_TABLET | Freq: Three times a day (TID) | ORAL | 0 refills | Status: DC | PRN

## 2022-07-23 MED ORDER — LEVOTHYROXINE SODIUM 75 MCG PO TABS: 75.0000 ug | ORAL_TABLET | Freq: Every day | ORAL | 1 refills | Status: DC

## 2022-07-23 MED ORDER — ROPINIROLE HCL ER 2 MG PO TB24: 2.0000 mg | ORAL_TABLET | Freq: Every day | ORAL | 0 refills | Status: DC

## 2022-07-23 NOTE — Progress Notes (Unsigned)
Subjective:  Patient ID: Theresa Nichols, female    DOB: 1955/08/21  Age: 67 y.o. MRN: [id number]  CC: No chief complaint on file.   HPI MECHELL SOUTHARD presents for ***  Outpatient Medications Prior to Visit  Medication Sig Dispense Refill   amLODipine (NORVASC) 5 MG tablet Take 1 tablet (5 mg total) by mouth daily. 30 tablet 2   Biotin 1 MG CAPS Take 1,000 mg by mouth daily.     cholecalciferol (VITAMIN D) 1000 UNITS tablet Take 1,000 Units by mouth daily.     fluocinonide cream (LIDEX) AB-123456789 % Apply 1 application topically as needed.     loratadine (CLARITIN) 10 MG tablet Take 1 tablet (10 mg total) by mouth daily as needed for allergies. 90 tablet 1   Multiple Vitamins-Minerals (MULTIVITAMIN & MINERAL PO) Take by mouth daily.     Multiple Vitamins-Minerals (ZINC PO) Take by mouth.     Na Sulfate-K Sulfate-Mg Sulf (SUPREP BOWEL PREP KIT) 17.5-3.13-1.6 GM/177ML SOLN Take 1 kit by mouth as directed. For colonoscopy prep 354 mL 0   olmesartan (BENICAR) 20 MG tablet Take 1 tablet (20 mg total) by mouth daily. 90 tablet 0   pantoprazole (PROTONIX) 40 MG tablet Take 1 tablet (40 mg total) by mouth daily. 90 tablet 3   predniSONE (DELTASONE) 20 MG tablet Take 2 tablets (40 mg total) by mouth daily with breakfast. 10 tablet 0   promethazine-dextromethorphan (PROMETHAZINE-DM) 6.25-15 MG/5ML syrup Take 5 mLs by mouth 3 (three) times daily as needed for cough. 200 mL 0   Turmeric (QC TUMERIC COMPLEX) 500 MG CAPS Take by mouth.     valACYclovir (VALTREX) 500 MG tablet Take 1 tablet (500 mg total) by mouth daily. 90 tablet 1   cyclobenzaprine (FLEXERIL) 5 MG tablet Take 1 tablet (5 mg total) by mouth 3 (three) times daily as needed for muscle spasms. 270 tablet 0   diclofenac Sodium (VOLTAREN) 1 % GEL APPLY 2 GRAMS TOPICALLY 4 TIMES DAILY 300 g 0   fluticasone (FLONASE) 50 MCG/ACT nasal spray Place 2 sprays into both nostrils daily. 48 g 1   levothyroxine (SYNTHROID) 75 MCG tablet TAKE 1  TABLET BY MOUTH ONCE DAILY BEFORE BREAKFAST 90 tablet 0   No facility-administered medications prior to visit.    ROS Review of Systems  Objective:  BP (!) 144/88 (BP Location: Left Arm, Patient Position: Sitting, Cuff Size: Large)   Pulse 63   Temp 97.8 F (36.6 C) (Oral)   Ht 5\' 2"  (1.575 m)   Wt 181 lb (82.1 kg)   SpO2 96%   BMI 33.11 kg/m   BP Readings from Last 3 Encounters:  07/23/22 (!) 144/88  07/13/22 135/79  07/06/22 138/60    Wt Readings from Last 3 Encounters:  07/23/22 181 lb (82.1 kg)  07/06/22 183 lb (83 kg)  06/13/22 182 lb (82.6 kg)    Physical Exam  Lab Results  Component Value Date   WBC 6.6 07/23/2022   HGB 14.4 07/23/2022   HCT 43.2 07/23/2022   PLT 346.0 07/23/2022   GLUCOSE 89 05/07/2022   CHOL 202 (H) 09/27/2021   TRIG 82.0 09/27/2021   HDL 55.20 09/27/2021   LDLCALC 130 (H) 09/27/2021   ALT 15 09/27/2021   AST 16 09/27/2021   NA 141 05/07/2022   K 4.1 05/07/2022   CL 102 05/07/2022   CREATININE 0.80 05/07/2022   BUN 17 05/07/2022   CO2 30 05/07/2022   TSH 1.04 07/23/2022  No results found.  Assessment & Plan:  Restless legs syndrome (RLS) -     IBC + Ferritin; Future -     CBC with Differential/Platelet; Future -     rOPINIRole HCl ER; Take 1 tablet (2 mg total) by mouth at bedtime.  Dispense: 30 tablet; Refill: 0  Seasonal allergic rhinitis due to pollen -     Fluticasone Propionate; Place 2 sprays into both nostrils daily.  Dispense: 48 g; Refill: 1  Chronic bilateral low back pain without sciatica -     Cyclobenzaprine HCl; Take 1 tablet (5 mg total) by mouth 3 (three) times daily as needed for muscle spasms.  Dispense: 270 tablet; Refill: 0  Hypothyroidism, unspecified type -     TSH; Future -     Levothyroxine Sodium; Take 1 tablet (75 mcg total) by mouth daily before breakfast.  Dispense: 90 tablet; Refill: 1  Need for vaccination -     Pneumococcal conjugate vaccine 20-valent  Primary osteoarthritis of both  knees -     Diclofenac Sodium; Apply 2 g topically 4 (four) times daily.  Dispense: 300 g; Refill: 1     Follow-up: Return in about 4 months (around 11/22/2022).  Scarlette Calico, MD

## 2022-07-24 ENCOUNTER — Encounter: Payer: Self-pay | Admitting: Internal Medicine

## 2022-07-24 ENCOUNTER — Telehealth: Payer: Self-pay

## 2022-07-24 MED ORDER — PREGABALIN 25 MG PO CAPS: 25.0000 mg | ORAL_CAPSULE | Freq: Every day | ORAL | 0 refills | Status: DC

## 2022-07-24 NOTE — Telephone Encounter (Signed)
Your request has been denied Request Reference Number: [id number]. ROPINIROLE TAB 2MG  ER is denied for not meeting the prior authorization requirement(s).  Per [employer] the requirements are gonig to be faxed.

## 2022-07-24 NOTE — Telephone Encounter (Signed)
Key: [id number]

## 2022-07-27 ENCOUNTER — Other Ambulatory Visit: Payer: Self-pay | Admitting: Internal Medicine

## 2022-07-27 DIAGNOSIS — M159 Polyosteoarthritis, unspecified: Secondary | ICD-10-CM

## 2022-07-27 DIAGNOSIS — G8929 Other chronic pain: Secondary | ICD-10-CM

## 2022-07-27 DIAGNOSIS — K580 Irritable bowel syndrome with diarrhea: Secondary | ICD-10-CM

## 2022-08-03 ENCOUNTER — Other Ambulatory Visit: Payer: Self-pay | Admitting: Internal Medicine

## 2022-08-03 ENCOUNTER — Encounter: Payer: 59 | Admitting: Gastroenterology

## 2022-08-03 DIAGNOSIS — I10 Essential (primary) hypertension: Secondary | ICD-10-CM

## 2022-08-03 MED ORDER — OLMESARTAN MEDOXOMIL 20 MG PO TABS: 20.0000 mg | ORAL_TABLET | Freq: Every day | ORAL | 0 refills | Status: DC

## 2022-08-09 ENCOUNTER — Encounter: Payer: Self-pay | Admitting: Internal Medicine

## 2022-08-10 ENCOUNTER — Other Ambulatory Visit: Payer: Self-pay | Admitting: Internal Medicine

## 2022-08-13 ENCOUNTER — Encounter: Payer: Self-pay | Admitting: Internal Medicine

## 2022-08-13 ENCOUNTER — Other Ambulatory Visit (INDEPENDENT_AMBULATORY_CARE_PROVIDER_SITE_OTHER): Payer: 59

## 2022-08-13 ENCOUNTER — Other Ambulatory Visit: Payer: Self-pay | Admitting: Internal Medicine

## 2022-08-13 DIAGNOSIS — E559 Vitamin D deficiency, unspecified: Secondary | ICD-10-CM | POA: Diagnosis not present

## 2022-08-13 DIAGNOSIS — H2513 Age-related nuclear cataract, bilateral: Secondary | ICD-10-CM | POA: Insufficient documentation

## 2022-08-13 DIAGNOSIS — R7989 Other specified abnormal findings of blood chemistry: Secondary | ICD-10-CM

## 2022-08-13 DIAGNOSIS — H04123 Dry eye syndrome of bilateral lacrimal glands: Secondary | ICD-10-CM | POA: Insufficient documentation

## 2022-08-13 LAB — VITAMIN D 25 HYDROXY (VIT D DEFICIENCY, FRACTURES): VITD: 32.77 ng/mL (ref 30.00–100.00)

## 2022-08-13 LAB — VITAMIN B12: Vitamin B-12: 542 pg/mL (ref 211–911)

## 2022-08-23 ENCOUNTER — Telehealth: Payer: Self-pay | Admitting: *Deleted

## 2022-08-23 NOTE — Telephone Encounter (Signed)
  Second attempt to reach pt for pre-vist unsuccessful. LM with facility # for pt to call back. Instructed pt to call # given by end of the day and reschedule the pre-visit or the scheduled procedure will be canceled.  RN will verify if pt has rescheduled pre-visit or not at end of day and if no per protocol Procedure will be canceled,

## 2022-08-23 NOTE — Telephone Encounter (Signed)
Attempt to reach pt for pre-visit. LM with call back #. Will attempt other number in profile Will attempt to reach again in due to no other # listed in profile

## 2022-08-29 ENCOUNTER — Encounter: Payer: Self-pay | Admitting: Internal Medicine

## 2022-08-30 ENCOUNTER — Other Ambulatory Visit: Payer: Self-pay | Admitting: Internal Medicine

## 2022-08-30 DIAGNOSIS — K2100 Gastro-esophageal reflux disease with esophagitis, without bleeding: Secondary | ICD-10-CM

## 2022-08-30 MED ORDER — RABEPRAZOLE SODIUM 20 MG PO TBEC: 20.0000 mg | DELAYED_RELEASE_TABLET | Freq: Every day | ORAL | 1 refills | Status: DC

## 2022-08-30 MED ORDER — FAMOTIDINE 40 MG PO TABS
40.0000 mg | ORAL_TABLET | Freq: Every day | ORAL | 1 refills | Status: DC
Start: 2022-08-30 — End: 2023-03-28

## 2022-09-14 ENCOUNTER — Ambulatory Visit: Payer: 59 | Admitting: Internal Medicine

## 2022-09-14 ENCOUNTER — Encounter: Payer: 59 | Admitting: Gastroenterology

## 2022-09-18 ENCOUNTER — Encounter: Payer: Self-pay | Admitting: Internal Medicine

## 2022-09-27 ENCOUNTER — Other Ambulatory Visit: Payer: Self-pay | Admitting: Internal Medicine

## 2022-09-27 DIAGNOSIS — Z1231 Encounter for screening mammogram for malignant neoplasm of breast: Secondary | ICD-10-CM

## 2022-11-02 ENCOUNTER — Telehealth: Payer: Self-pay | Admitting: Internal Medicine

## 2022-11-02 NOTE — Telephone Encounter (Signed)
-----   Message from Sanda Linger sent at 06/01/2022  2:07 PM EST ----- Regarding: lung nodule Recheck the CT

## 2022-11-05 ENCOUNTER — Ambulatory Visit: Payer: 59 | Admitting: *Deleted

## 2022-11-05 VITALS — Ht 62.0 in | Wt 175.0 lb

## 2022-11-05 DIAGNOSIS — R140 Abdominal distension (gaseous): Secondary | ICD-10-CM

## 2022-11-05 DIAGNOSIS — Z1211 Encounter for screening for malignant neoplasm of colon: Secondary | ICD-10-CM

## 2022-11-05 MED ORDER — NA SULFATE-K SULFATE-MG SULF 17.5-3.13-1.6 GM/177ML PO SOLN
1.0000 | Freq: Once | ORAL | 0 refills | Status: AC
Start: 2022-11-05 — End: 2022-11-05

## 2022-11-05 NOTE — Progress Notes (Signed)
Pt's name and DOB verified at the beginning of the pre-visit.  Pt denies any difficulty with ambulating,sitting, laying down or rolling side to side Gave both [facility] main # and MD on call # prior to instructions.  No egg or soy allergy known to patient  No issues known to pt with past sedation with any surgeries or procedures Pt denies having issues being intubated Pt has no issues moving head neck or swallowing No FH of Malignant Hyperthermia Pt is not on diet pills Pt is not on home 02  Pt is not on blood thinners  instructions a week before prep days. Pt states they will Pt is not on dialysis Pt denise any abnormal heart rhythms  Pt denies any upcoming cardiac testing Pt encouraged to use to use Singlecare or Goodrx to reduce cost  Patient's chart reviewed by Theresa Nichols CNRA prior to pre-visit and patient appropriate for the [facility].  Pre-visit completed and red dot placed by patient's name on their procedure day (on provider's schedule).  . Visit by phone Pt states weight is 175 LB Instructed pt why it is important to and  to call if they have any changes in health or new medications. Directed them to the # given and on instructions.   Pt states they will.  Instructions reviewed with pt and pt states understanding. Instructed to review again prior to procedure. Pt states they will.  Instructions sent by mail with coupon and by my chart Instructions sent to daughters address per pt's request

## 2022-11-10 ENCOUNTER — Other Ambulatory Visit: Payer: Self-pay | Admitting: Internal Medicine

## 2022-11-10 DIAGNOSIS — I10 Essential (primary) hypertension: Secondary | ICD-10-CM

## 2022-11-21 ENCOUNTER — Encounter (INDEPENDENT_AMBULATORY_CARE_PROVIDER_SITE_OTHER): Payer: Self-pay

## 2022-11-26 ENCOUNTER — Ambulatory Visit: Payer: 59 | Admitting: Internal Medicine

## 2022-12-03 ENCOUNTER — Telehealth: Payer: Self-pay | Admitting: Gastroenterology

## 2022-12-03 ENCOUNTER — Telehealth: Payer: Self-pay | Admitting: *Deleted

## 2022-12-03 NOTE — Telephone Encounter (Signed)
Patient returned call and advised she had taken 1/2 of a pill of Phentermine on 8/10.  Appt rescheduled for 8/20 and new instructions mailed to daughter's address [address]  [place], [place] [address]

## 2022-12-03 NOTE — Telephone Encounter (Signed)
PT returned call. Wanted [place] to know she started diet pills and amino acid injections. She took a half of diet pill Saturday and an injection on the third. Please advise.

## 2022-12-03 NOTE — Telephone Encounter (Signed)
Attempted to return patient call. Left message that per her current medication list nothing needed to be help other than vitamins with iron or NSAIDS or fiber. IF she has started something new, then please call back .

## 2022-12-03 NOTE — Telephone Encounter (Signed)
Patient called requested to speak with a nurse regarding her prep instructions and certain medications she was to stop a week prior.

## 2022-12-07 ENCOUNTER — Encounter: Payer: 59 | Admitting: Gastroenterology

## 2022-12-07 NOTE — Telephone Encounter (Signed)
Ok, thanks.

## 2022-12-11 ENCOUNTER — Encounter: Payer: 59 | Admitting: Gastroenterology

## 2022-12-12 ENCOUNTER — Ambulatory Visit: Payer: 59 | Admitting: Internal Medicine

## 2022-12-12 ENCOUNTER — Encounter: Payer: Self-pay | Admitting: Internal Medicine

## 2022-12-12 VITALS — BP 142/78 | HR 82 | Temp 97.9°F | Resp 16 | Ht 62.0 in | Wt 172.8 lb

## 2022-12-12 DIAGNOSIS — E785 Hyperlipidemia, unspecified: Secondary | ICD-10-CM

## 2022-12-12 DIAGNOSIS — E039 Hypothyroidism, unspecified: Secondary | ICD-10-CM | POA: Diagnosis not present

## 2022-12-12 DIAGNOSIS — R7989 Other specified abnormal findings of blood chemistry: Secondary | ICD-10-CM | POA: Diagnosis not present

## 2022-12-12 DIAGNOSIS — Z114 Encounter for screening for human immunodeficiency virus [HIV]: Secondary | ICD-10-CM

## 2022-12-12 DIAGNOSIS — Z0001 Encounter for general adult medical examination with abnormal findings: Secondary | ICD-10-CM

## 2022-12-12 DIAGNOSIS — Z1159 Encounter for screening for other viral diseases: Secondary | ICD-10-CM

## 2022-12-12 DIAGNOSIS — Z124 Encounter for screening for malignant neoplasm of cervix: Secondary | ICD-10-CM

## 2022-12-12 DIAGNOSIS — I10 Essential (primary) hypertension: Secondary | ICD-10-CM | POA: Diagnosis not present

## 2022-12-12 LAB — LIPID PANEL
Cholesterol: 206 mg/dL — ABNORMAL HIGH (ref 0–200)
HDL: 44.2 mg/dL (ref >39.00)
LDL Cholesterol: 137 mg/dL — ABNORMAL HIGH (ref 0–99)
NonHDL: 161.76
Total CHOL/HDL Ratio: 5
Triglycerides: 124 mg/dL (ref 0.0–149.0)
VLDL: 24.8 mg/dL (ref 0.0–40.0)

## 2022-12-12 LAB — BASIC METABOLIC PANEL
BUN: 21 mg/dL (ref 6–23)
CO2: 30 meq/L (ref 19–32)
Calcium: 9.4 mg/dL (ref 8.4–10.5)
Chloride: 104 mEq/L (ref 96–112)
Creatinine, Ser: 0.96 mg/dL (ref 0.40–1.20)
GFR: 61.95 mL/min (ref >60.00)
Glucose, Bld: 82 mg/dL (ref 70–99)
Potassium: 4.3 meq/L (ref 3.5–5.1)
Sodium: 140 meq/L (ref 135–145)

## 2022-12-12 MED ORDER — OLMESARTAN MEDOXOMIL 20 MG PO TABS: 20.0000 mg | ORAL_TABLET | Freq: Every day | ORAL | 0 refills | Status: DC

## 2022-12-12 NOTE — Patient Instructions (Signed)

## 2022-12-12 NOTE — Progress Notes (Signed)
Subjective:  Patient ID: Theresa Nichols, female    DOB: 04-Jan-1956  Age: 67 y.o. MRN: [id number]  CC: Annual Exam, Hypertension, and Hyperlipidemia   HPI FARRYN BROTHER presents for a CPX and f/up ----  Discussed the use of AI scribe software for clinical note transcription with the patient, who gave verbal consent to proceed.  History of Present Illness   The patient, with a history of hypertension, thyroid disorder, and gastroesophageal reflux disease (GERD), reports no chest pain, shortness of breath, dizziness, or lightheadedness. She has been more active recently and has lost some weight, which she attributes to thyroid medication prescribed by Dr. Sharma Covert. She took half the recommended dose for a month and has since discontinued it.  The patient has been experiencing issues with GERD. She switched back to Protonix after finding other medications less effective. She reports symptoms of reflux regardless of diet, but denies dysphagia or odynophagia. She is scheduled for an upper and lower endoscopy to further evaluate these symptoms.  The patient also reports knee pain, which she believes is exacerbated by her weight. She expresses a desire to lose more weight to alleviate this discomfort. She has been under significant stress due to a friend's health issues, which she believes may be contributing to an increase in her blood pressure. She has previously tried a blood pressure medication but discontinued it due to hypotension. She is open to trying it again or considering other options.       Outpatient Medications Prior to Visit  Medication Sig Dispense Refill   cholecalciferol (VITAMIN D) 1000 UNITS tablet Take 1,000 Units by mouth daily.     cyclobenzaprine (FLEXERIL) 5 MG tablet Take 1 tablet (5 mg total) by mouth 3 (three) times daily as needed for muscle spasms. 270 tablet 0   diclofenac Sodium (VOLTAREN) 1 % GEL Apply 2 g topically 4 (four) times daily. 300 g 1    fluocinonide cream (LIDEX) 0.05 % Apply 1 application topically as needed.     fluticasone (FLONASE) 50 MCG/ACT nasal spray Place 2 sprays into both nostrils daily. 48 g 1   levothyroxine (SYNTHROID) 75 MCG tablet Take 1 tablet (75 mcg total) by mouth daily before breakfast. 90 tablet 1   loratadine (CLARITIN) 10 MG tablet Take 1 tablet (10 mg total) by mouth daily as needed for allergies. 90 tablet 1   Multiple Vitamins-Minerals (MULTIVITAMIN & MINERAL PO) Take by mouth daily.     pantoprazole (PROTONIX) 40 MG tablet Take 40 mg by mouth daily.     valACYclovir (VALTREX) 500 MG tablet Take 1 tablet (500 mg total) by mouth daily. 90 tablet 1   famotidine (PEPCID) 40 MG tablet Take 1 tablet (40 mg total) by mouth daily. (Patient not taking: Reported on 11/05/2022) 90 tablet 1   Multiple Vitamins-Minerals (ZINC PO) Take by mouth. (Patient not taking: Reported on 12/12/2022)     pregabalin (LYRICA) 25 MG capsule Take 1 capsule (25 mg total) by mouth at bedtime. (Patient not taking: Reported on 11/05/2022) 90 capsule 0   Turmeric (QC TUMERIC COMPLEX) 500 MG CAPS Take by mouth. (Patient not taking: Reported on 11/05/2022)     RABEprazole (ACIPHEX) 20 MG tablet Take 1 tablet (20 mg total) by mouth daily. (Patient not taking: Reported on 11/05/2022) 90 tablet 1   No facility-administered medications prior to visit.    ROS Review of Systems  Constitutional:  Negative for appetite change, diaphoresis, fatigue and unexpected weight change.  HENT: Negative.  Eyes: Negative.   Respiratory: Negative.  Negative for chest tightness, shortness of breath and wheezing.   Cardiovascular:  Negative for chest pain, palpitations and leg swelling.  Gastrointestinal:  Negative for abdominal pain, constipation, diarrhea, nausea and vomiting.  Endocrine: Negative.   Genitourinary: Negative.  Negative for difficulty urinating, dysuria and hematuria.  Musculoskeletal:  Positive for arthralgias. Negative for joint swelling  and myalgias.  Skin: Negative.  Negative for color change and rash.  Neurological:  Negative for dizziness, weakness and headaches.  Hematological:  Negative for adenopathy. Does not bruise/bleed easily.  Psychiatric/Behavioral: Negative.      Objective:  BP (!) 142/78 (BP Location: Left Arm, Patient Position: Sitting, Cuff Size: Normal)   Pulse 82   Temp 97.9 F (36.6 C) (Oral)   Resp 16   Ht 5\' 2"  (1.575 m)   Wt 172 lb 12.8 oz (78.4 kg)   SpO2 98%   BMI 31.61 kg/m   BP Readings from Last 3 Encounters:  12/12/22 (!) 142/78  07/23/22 (!) 144/88  07/13/22 135/79    Wt Readings from Last 3 Encounters:  12/12/22 172 lb 12.8 oz (78.4 kg)  11/05/22 175 lb (79.4 kg)  07/23/22 181 lb (82.1 kg)    Physical Exam Vitals reviewed.  Constitutional:      Appearance: Normal appearance.  HENT:     Mouth/Throat:     Mouth: Mucous membranes are moist.  Eyes:     General: No scleral icterus.    Conjunctiva/sclera: Conjunctivae normal.  Cardiovascular:     Rate and Rhythm: Normal rate and regular rhythm.     Heart sounds: No murmur heard.    No gallop.  Pulmonary:     Effort: Pulmonary effort is normal.     Breath sounds: No stridor. No wheezing, rhonchi or rales.  Abdominal:     General: Abdomen is flat.     Palpations: There is no mass.     Tenderness: There is no abdominal tenderness. There is no guarding.     Hernia: No hernia is present.  Musculoskeletal:        General: Normal range of motion.     Cervical back: Neck supple.     Right lower leg: No edema.     Left lower leg: No edema.  Lymphadenopathy:     Cervical: No cervical adenopathy.  Skin:    General: Skin is warm and dry.  Neurological:     General: No focal deficit present.     Mental Status: She is alert. Mental status is at baseline.  Psychiatric:        Mood and Affect: Mood normal.        Behavior: Behavior normal.     Lab Results  Component Value Date   WBC 6.6 07/23/2022   HGB 14.4  07/23/2022   HCT 43.2 07/23/2022   PLT 346.0 07/23/2022   GLUCOSE 82 12/12/2022   CHOL 206 (H) 12/12/2022   TRIG 124.0 12/12/2022   HDL 44.20 12/12/2022   LDLCALC 137 (H) 12/12/2022   ALT 15 09/27/2021   AST 16 09/27/2021   NA 140 12/12/2022   K 4.3 12/12/2022   CL 104 12/12/2022   CREATININE 0.96 12/12/2022   BUN 21 12/12/2022   CO2 30 12/12/2022   TSH 2.53 12/12/2022    No results found.  Assessment & Plan:  Encounter for screening for HIV -     HIV Antibody (routine testing w rflx); Future  Need for hepatitis C screening test -  Hepatitis C antibody; Future  Acquired hypothyroidism- She is euthyroid. -     TSH; Future  Primary hypertension- Her BP is not at the goal of 130/80.  Will start an ARB. -     Basic metabolic panel; Future -     Olmesartan Medoxomil; Take 1 tablet (20 mg total) by mouth daily.  Dispense: 90 tablet; Refill: 0  Low vitamin B12 level -     Vitamin B12; Future  Hyperlipidemia LDL goal <130- Will start a statin for cardiovascular risk reduction. -     Lipid panel; Future -     Rosuvastatin Calcium; Take 1 tablet (10 mg total) by mouth daily.  Dispense: 90 tablet; Refill: 1  Encounter for general adult medical examination with abnormal findings - Exam completed, labs reviewed, vaccines reviewed and updated, cancer screenings addressed, pt ed material was given.      Follow-up: Return in about 6 months (around 06/14/2023).  Sanda Linger, MD

## 2022-12-13 LAB — HIV ANTIBODY (ROUTINE TESTING W REFLEX): HIV 1&2 Ab, 4th Generation: NONREACTIVE

## 2022-12-13 LAB — HEPATITIS C ANTIBODY: Hepatitis C Ab: NONREACTIVE

## 2022-12-14 DIAGNOSIS — Z1159 Encounter for screening for other viral diseases: Secondary | ICD-10-CM | POA: Insufficient documentation

## 2022-12-14 DIAGNOSIS — Z124 Encounter for screening for malignant neoplasm of cervix: Secondary | ICD-10-CM | POA: Insufficient documentation

## 2022-12-14 LAB — TSH: TSH: 2.53 u[IU]/mL (ref 0.35–5.50)

## 2022-12-14 LAB — VITAMIN B12: Vitamin B-12: 328 pg/mL (ref 211–911)

## 2022-12-14 MED ORDER — ROSUVASTATIN CALCIUM 10 MG PO TABS
10.0000 mg | ORAL_TABLET | Freq: Every day | ORAL | 1 refills | Status: DC
Start: 2022-12-14 — End: 2023-09-02

## 2022-12-16 ENCOUNTER — Other Ambulatory Visit: Payer: Self-pay | Admitting: Internal Medicine

## 2022-12-17 ENCOUNTER — Other Ambulatory Visit: Payer: Self-pay | Admitting: Internal Medicine

## 2022-12-17 MED ORDER — PANTOPRAZOLE SODIUM 40 MG PO TBEC: 40.0000 mg | DELAYED_RELEASE_TABLET | Freq: Every day | ORAL | 0 refills | Status: DC

## 2022-12-31 ENCOUNTER — Ambulatory Visit (INDEPENDENT_AMBULATORY_CARE_PROVIDER_SITE_OTHER): Payer: 59

## 2022-12-31 VITALS — BP 121/61 | HR 76 | Temp 97.9°F | Ht 62.0 in | Wt 172.0 lb

## 2022-12-31 DIAGNOSIS — Z0000 Encounter for general adult medical examination without abnormal findings: Secondary | ICD-10-CM

## 2022-12-31 NOTE — Progress Notes (Signed)
Subjective:   Theresa Nichols is a 67 y.o. female who presents for an Initial Medicare Annual Wellness Visit.  Visit Complete: Virtual  I connected with  Theresa Nichols on 12/31/22 by a audio enabled telemedicine application and verified that I am speaking with the correct person using two identifiers.  Patient Location: Home  Provider Location: Office/Clinic  I discussed the limitations of evaluation and management by telemedicine. The patient expressed understanding and agreed to proceed.  Patient Medicare AWV questionnaire was completed by the patient on N/A; I have confirmed that all information answered by patient is correct and no changes since this date.  Review of Systems    No ROS. Medicare Wellness Telephone Visit. Additional risk factors are reflected in social history. Cardiac Risk Factors include: dyslipidemia;hypertension;obesity (BMI >30kg/m2)     Objective:    Today's Vitals   12/31/22 0808 12/31/22 0820  BP: 121/61   Pulse: 76   Temp: 97.9 F (36.6 C)   SpO2: 98%   Weight: 172 lb (78 kg)   Height: 5\' 2"  (1.575 m)   PainSc:  1    Body mass index is 31.46 kg/m.     12/31/2022    8:24 AM  Advanced Directives  Does Patient Have a Medical Advance Directive? No  Would patient like information on creating a medical advance directive? Yes (MAU/Ambulatory/Procedural Areas - Information given)    Current Medications (verified) Outpatient Encounter Medications as of 12/31/2022  Medication Sig   cholecalciferol (VITAMIN D) 1000 UNITS tablet Take 1,000 Units by mouth daily.   cyclobenzaprine (FLEXERIL) 5 MG tablet Take 1 tablet (5 mg total) by mouth 3 (three) times daily as needed for muscle spasms.   diclofenac Sodium (VOLTAREN) 1 % GEL Apply 2 g topically 4 (four) times daily.   famotidine (PEPCID) 40 MG tablet Take 1 tablet (40 mg total) by mouth daily.   fluocinonide cream (LIDEX) 0.05 % Apply 1 application topically as needed.   fluticasone  (FLONASE) 50 MCG/ACT nasal spray Place 2 sprays into both nostrils daily.   levothyroxine (SYNTHROID) 75 MCG tablet Take 1 tablet (75 mcg total) by mouth daily before breakfast.   loratadine (CLARITIN) 10 MG tablet Take 1 tablet (10 mg total) by mouth daily as needed for allergies.   Multiple Vitamins-Minerals (MULTIVITAMIN & MINERAL PO) Take by mouth daily.   Multiple Vitamins-Minerals (ZINC PO) Take by mouth.   olmesartan (BENICAR) 20 MG tablet Take 1 tablet (20 mg total) by mouth daily.   pantoprazole (PROTONIX) 40 MG tablet Take 1 tablet (40 mg total) by mouth daily.   pregabalin (LYRICA) 25 MG capsule Take 1 capsule (25 mg total) by mouth at bedtime.   rosuvastatin (CRESTOR) 10 MG tablet Take 1 tablet (10 mg total) by mouth daily.   Turmeric (QC TUMERIC COMPLEX) 500 MG CAPS Take by mouth.   valACYclovir (VALTREX) 500 MG tablet Take 1 tablet (500 mg total) by mouth daily.   No facility-administered encounter medications on file as of 12/31/2022.    Allergies (verified) Maxalt [rizatriptan] and Topamax [topiramate]   History: Past Medical History:  Diagnosis Date   Allergy    seasonal   Anxiety    Arthritis    knees,left hip   Blood transfusion without reported diagnosis    1983   Common migraine with intractable migraine 09/27/2020   Depression    GERD (gastroesophageal reflux disease)    Heart murmur    History of concussion 09/27/2020   06/2017  Thyroid disease    hypothyroidism   Vitamin B12 deficiency    Past Surgical History:  Procedure Laterality Date   ABDOMINAL HYSTERECTOMY     DILATION AND CURETTAGE OF UTERUS     x3   KNEE SURGERY Right    Family History  Problem Relation Age of Onset   Diabetes Mother    Heart disease Mother    Diverticulosis Mother    Pancreatitis Mother    Asthma Mother    Heart disease Father    Arthritis Father    Seizures Brother    Kidney disease Brother    Breast cancer Cousin    Colon cancer Neg Hx    Colon polyps Neg Hx     Esophageal cancer Neg Hx    Rectal cancer Neg Hx    Stomach cancer Neg Hx    Social History   Socioeconomic History   Marital status: Divorced    Spouse name: Not on file   Number of children: Not on file   Years of education: Not on file   Highest education level: Not on file  Occupational History   Occupation: retired  Tobacco Use   Smoking status: Former    Current packs/day: 0.00    Average packs/day: 0.5 packs/day for 15.0 years (7.5 ttl pk-yrs)    Types: Cigarettes    Start date: 23    Quit date: 1989    Years since quitting: 35.7    Passive exposure: Never   Smokeless tobacco: Never  Vaping Use   Vaping status: Never Used  Substance and Sexual Activity   Alcohol use: Yes    Comment: 1-2  times per month   Drug use: Never   Sexual activity: Yes    Partners: Male  Other Topics Concern   Not on file  Social History Narrative   Lives with grandaughter   Left handed   Drinks 1-2 cups caffeine daily   Social Determinants of Health   Financial Resource Strain: Low Risk  (12/31/2022)   Overall Financial Resource Strain (CARDIA)    Difficulty of Paying Living Expenses: Not hard at all  Food Insecurity: No Food Insecurity (12/31/2022)   Hunger Vital Sign    Worried About Running Out of Food in the Last Year: Never true    Ran Out of Food in the Last Year: Never true  Transportation Needs: No Transportation Needs (12/31/2022)   PRAPARE - Administrator, Civil Service (Medical): No    Lack of Transportation (Non-Medical): No  Physical Activity: Insufficiently Active (12/31/2022)   Exercise Vital Sign    Days of Exercise per Week: 4 days    Minutes of Exercise per Session: 30 min  Stress: No Stress Concern Present (12/31/2022)   [employer] of Occupational Health - Occupational Stress Questionnaire    Feeling of Stress : Not at all  Social Connections: Unknown (12/31/2022)   Social Connection and Isolation Panel [NHANES]    Frequency of Communication  with Friends and Family: More than three times a week    Frequency of Social Gatherings with Friends and Family: More than three times a week    Attends Religious Services: More than 4 times per year    Active Member of [employer] or Organizations: No    Attends Banker Meetings: Never    Marital Status: Patient declined    Tobacco Counseling Counseling given: Not Answered   Clinical Intake:  Pre-visit preparation completed: Yes  Pain : 0-10 Pain  Score: 1  Pain Type: Chronic pain Pain Location: Back Pain Orientation: Lower Pain Descriptors / Indicators: Aching Pain Onset: More than a month ago Pain Frequency: Intermittent     BMI - recorded: 31 Nutritional Status: BMI > 30  Obese Nutritional Risks: None Diabetes: No  How often do you need to have someone help you when you read instructions, pamphlets, or other written materials from your doctor or pharmacy?: 1 - Never What is the last grade level you completed in school?: GED and CNA classes  Interpreter Needed?: No  Information entered by :: Elyse Jarvis, CMA   Activities of Daily Living    12/31/2022    8:24 AM  In your present state of health, do you have any difficulty performing the following activities:  Hearing? 0  Vision? 0  Difficulty concentrating or making decisions? 0  Walking or climbing stairs? 0  Dressing or bathing? 0  Doing errands, shopping? 0  Preparing Food and eating ? N  Using the Toilet? N  In the past six months, have you accidently leaked urine? N  Do you have problems with loss of bowel control? N  Managing your Medications? N  Managing your Finances? N  Housekeeping or managing your Housekeeping? N    Patient Care Team: Etta Grandchild, MD as PCP - General (Internal Medicine)  Indicate any recent Medical Services you may have received from other than [facility] providers in the past year (date may be approximate).     Assessment:   This is a routine wellness examination  for Edgar.  Hearing/Vision screen Patient denied any hearing difficulty. No hearing aids. Patient does wear corrective lenses.   Goals Addressed               This Visit's Progress     Weight (lb) < 172 lb (78 kg) (pt-stated)   172 lb (78 kg)     I would like to lose some weight.       Depression Screen    12/31/2022    8:23 AM 12/12/2022   10:26 AM 05/07/2022   11:32 AM 03/15/2021   10:10 AM  PHQ 2/9 Scores  PHQ - 2 Score 0 0 0 0  PHQ- 9 Score   0     Fall Risk    12/31/2022    8:24 AM 12/12/2022   10:25 AM  Fall Risk   Falls in the past year? 0 0  Number falls in past yr: 0 0  Injury with Fall? 0 0  Risk for fall due to : No Fall Risks No Fall Risks  Follow up Falls evaluation completed Falls evaluation completed    MEDICARE RISK AT HOME: Medicare Risk at Home Any stairs in or around the home?: Yes If so, are there any without handrails?: No Home free of loose throw rugs in walkways, pet beds, electrical cords, etc?: Yes Adequate lighting in your home to reduce risk of falls?: Yes Life alert?: No Use of a cane, walker or w/c?: No Grab bars in the bathroom?: Yes Shower chair or bench in shower?: No Elevated toilet seat or a handicapped toilet?: No  TIMED UP AND GO:  Was the test performed? No    Cognitive Function:  Patient is cogitatively intact.      12/31/2022    8:24 AM  6CIT Screen  What Year? 0 points  What month? 0 points  What time? 0 points  Count back from 20 0 points  Months in reverse  0 points  Repeat phrase 0 points  Total Score 0 points    Immunizations Immunization History  Administered Date(s) Administered   Influenza,inj,Quad PF,6+ Mos 01/31/2022   Moderna Sars-Covid-2 Vaccination 07/03/2019, 07/31/2019   PNEUMOCOCCAL CONJUGATE-20 07/23/2022   Td 04/23/2016   Zoster Recombinant(Shingrix) 09/25/2021    TDAP status: Up to date  Flu Vaccine status: Declined, Education has been provided regarding the importance of this  vaccine but patient still declined. Advised may receive this vaccine at local pharmacy or Health Dept. Aware to provide a copy of the vaccination record if obtained from local pharmacy or Health Dept. Verbalized acceptance and understanding.  Pneumococcal vaccine status: Up to date  Covid-19 vaccine status: Completed vaccines  Qualifies for Shingles Vaccine? Yes   Zostavax completed No   Shingrix Completed?: No.    Education has been provided regarding the importance of this vaccine. Patient has been advised to call insurance company to determine out of pocket expense if they have not yet received this vaccine. Advised may also receive vaccine at local pharmacy or Health Dept. Verbalized acceptance and understanding.  Screening Tests Health Maintenance  Topic Date Due   PAP SMEAR-Modifier  Never done   Medicare Annual Wellness (AWV)  01/12/2023 (Originally 17-Feb-1956)   COVID-19 Vaccine (3 - 2023-24 season) 01/16/2023 (Originally 12/23/2022)   Zoster Vaccines- Shingrix (2 of 2) 04/01/2023 (Originally 11/20/2021)   INFLUENZA VACCINE  07/22/2023 (Originally 11/22/2022)   MAMMOGRAM  11/02/2023   Fecal DNA (Cologuard)  04/05/2024   DTaP/Tdap/Td (2 - Tdap) 04/23/2026   Pneumonia Vaccine 28+ Years old  Completed   DEXA SCAN  Completed   Hepatitis C Screening  Completed   HIV Screening  Completed   HPV VACCINES  Aged Out    Health Maintenance  Health Maintenance Due  Topic Date Due   PAP SMEAR-Modifier  Never done    Colorectal cancer screening: Type of screening: Cologuard. Completed 04/05/2021. Repeat every 3 years  Mammogram status: Ordered 09/27/2022. Pt provided with contact info and advised to call to schedule appt.   Bone Density status: Completed 06/29/2020. Results reflect: Bone density results: NORMAL. Repeat every N/A years.  Lung Cancer Screening: (Low Dose CT Chest recommended if Age 11-80 years, 20 pack-year currently smoking OR have quit w/in 15years.) does not qualify.    Lung Cancer Screening Referral: N/A  Additional Screening:  Hepatitis C Screening: does qualify; Completed 12/12/2022  Vision Screening: Recommended annual ophthalmology exams for early detection of glaucoma and other disorders of the eye. Is the patient up to date with their annual eye exam?  Yes  Who is the provider or what is the name of the office in which the patient attends annual eye exams? Dr. Dan Humphreys on [address] If pt is not established with a provider, would they like to be referred to a provider to establish care? No .   Dental Screening: Recommended annual dental exams for proper oral hygiene   Community Resource Referral / Chronic Care Management: CRR required this visit?  No   CCM required this visit?  No     Plan:     I have personally reviewed and noted the following in the patient's chart:   Medical and social history Use of alcohol, tobacco or illicit drugs  Current medications and supplements including opioid prescriptions. Patient is not currently taking opioid prescriptions. Functional ability and status Nutritional status Physical activity Advanced directives List of other physicians Hospitalizations, surgeries, and ER visits in previous 12 months Vitals Screenings  to include cognitive, depression, and falls Referrals and appointments  In addition, I have reviewed and discussed with patient certain preventive protocols, quality metrics, and best practice recommendations. A written personalized care plan for preventive services as well as general preventive health recommendations were provided to patient.     Marinus Maw, CMA   12/31/2022   After Visit Summary: (MyChart) Due to this being a telephonic visit, the after visit summary with patients personalized plan was offered to patient via MyChart   Nurse Notes: Information on completing advanced directives mailed to address on file

## 2022-12-31 NOTE — Patient Instructions (Signed)
It was great speaking with you today!  Please schedule your next Medicare Wellness Visit with your Nurse Health Advisor in 1 year by calling [phone]. 

## 2023-01-13 ENCOUNTER — Encounter: Payer: Self-pay | Admitting: Internal Medicine

## 2023-01-23 ENCOUNTER — Encounter: Payer: Self-pay | Admitting: Certified Registered Nurse Anesthetist

## 2023-01-24 ENCOUNTER — Telehealth: Payer: Self-pay | Admitting: Gastroenterology

## 2023-01-24 NOTE — Telephone Encounter (Signed)
Patient canceled procedure and will call back to reschedule.   Patient was exposed to Covid on Wednesday.

## 2023-01-25 ENCOUNTER — Encounter: Payer: 59 | Admitting: Gastroenterology

## 2023-02-01 ENCOUNTER — Other Ambulatory Visit: Payer: Self-pay | Admitting: Internal Medicine

## 2023-02-01 DIAGNOSIS — R911 Solitary pulmonary nodule: Secondary | ICD-10-CM | POA: Insufficient documentation

## 2023-02-01 NOTE — Telephone Encounter (Signed)
-----   Message from Sanda Linger sent at 06/01/2022  2:07 PM EST ----- Regarding: lung nodule Recheck the CT

## 2023-02-04 ENCOUNTER — Encounter: Payer: Self-pay | Admitting: Internal Medicine

## 2023-02-05 ENCOUNTER — Other Ambulatory Visit: Payer: Self-pay | Admitting: Internal Medicine

## 2023-02-05 DIAGNOSIS — M170 Bilateral primary osteoarthritis of knee: Secondary | ICD-10-CM

## 2023-02-05 DIAGNOSIS — M150 Primary generalized (osteo)arthritis: Secondary | ICD-10-CM

## 2023-02-05 DIAGNOSIS — M545 Low back pain, unspecified: Secondary | ICD-10-CM

## 2023-02-05 MED ORDER — DICLOFENAC SODIUM 25 MG PO TBEC
25.0000 mg | DELAYED_RELEASE_TABLET | Freq: Three times a day (TID) | ORAL | 0 refills | Status: DC
Start: 2023-02-05 — End: 2023-05-05

## 2023-02-06 DIAGNOSIS — M2242 Chondromalacia patellae, left knee: Secondary | ICD-10-CM | POA: Insufficient documentation
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
## 2023-03-08 ENCOUNTER — Encounter: Payer: Self-pay | Admitting: Internal Medicine

## 2023-03-08 ENCOUNTER — Ambulatory Visit: Payer: 59 | Attending: Internal Medicine | Admitting: Internal Medicine

## 2023-03-08 VITALS — BP 156/83 | HR 66 | Resp 14 | Ht 62.0 in | Wt 166.0 lb

## 2023-03-08 DIAGNOSIS — M25512 Pain in left shoulder: Secondary | ICD-10-CM | POA: Diagnosis not present

## 2023-03-08 DIAGNOSIS — I10 Essential (primary) hypertension: Secondary | ICD-10-CM | POA: Diagnosis not present

## 2023-03-08 DIAGNOSIS — I7300 Raynaud's syndrome without gangrene: Secondary | ICD-10-CM

## 2023-03-08 DIAGNOSIS — M150 Primary generalized (osteo)arthritis: Secondary | ICD-10-CM

## 2023-03-08 DIAGNOSIS — G8929 Other chronic pain: Secondary | ICD-10-CM

## 2023-03-08 MED ORDER — CYCLOBENZAPRINE HCL 5 MG PO TABS
5.0000 mg | ORAL_TABLET | Freq: Every evening | ORAL | 0 refills | Status: AC | PRN
Start: 2023-03-08 — End: ?

## 2023-03-08 NOTE — Patient Instructions (Signed)

## 2023-03-13 ENCOUNTER — Other Ambulatory Visit: Payer: Self-pay | Admitting: Internal Medicine

## 2023-03-13 DIAGNOSIS — E039 Hypothyroidism, unspecified: Secondary | ICD-10-CM

## 2023-03-25 ENCOUNTER — Telehealth: Payer: Self-pay | Admitting: Internal Medicine

## 2023-03-25 NOTE — Telephone Encounter (Signed)
Patient called stating she has "a knot on her right calf" which is swollen and painful.  Patient states it is not red and not warm to touch.  Patient requested to be seen today.  Patient was told that Dr. Dimple Casey is out of the office.  Patient requested a return call to let her know if she should schedule with her PCP or orthopedic doctor.

## 2023-03-26 ENCOUNTER — Ambulatory Visit: Payer: 59 | Admitting: Emergency Medicine

## 2023-03-27 ENCOUNTER — Ambulatory Visit: Payer: 59 | Admitting: Emergency Medicine

## 2023-04-05 ENCOUNTER — Telehealth: Payer: Self-pay | Admitting: Pharmacist

## 2023-04-05 NOTE — Telephone Encounter (Signed)
Contacted patient to check on olmesartan refill appearing 1 month late and check on BP.  Arbutus Leas, PharmD, BCPS, CPP Clinical Pharmacist Practitioner [facility] [phone]

## 2023-04-09 ENCOUNTER — Ambulatory Visit: Payer: 59

## 2023-04-24 HISTORY — PX: ROOT CANAL: SHX2363

## 2023-04-30 ENCOUNTER — Encounter: Payer: Self-pay | Admitting: Internal Medicine

## 2023-05-04 ENCOUNTER — Other Ambulatory Visit: Payer: Self-pay | Admitting: Internal Medicine

## 2023-05-04 DIAGNOSIS — M170 Bilateral primary osteoarthritis of knee: Secondary | ICD-10-CM

## 2023-05-04 DIAGNOSIS — M150 Primary generalized (osteo)arthritis: Secondary | ICD-10-CM

## 2023-05-04 DIAGNOSIS — G8929 Other chronic pain: Secondary | ICD-10-CM

## 2023-05-06 ENCOUNTER — Ambulatory Visit: Payer: Self-pay | Admitting: Internal Medicine

## 2023-05-06 NOTE — Telephone Encounter (Signed)
  Chief Complaint: Leg Pain Symptoms: knot to right leg Frequency: going for over a month Pertinent Negatives: Patient denies CP, SOB, fever, redness Disposition: [] ED /[] Urgent Care (no appt availability in office) / [] Appointment(In office/virtual)/ []  [facility]/ [] Home Care/ [] Refused Recommended Disposition /[] [facility]/ [x]  Follow-up with PCP Additional Notes: patient calling to get some verification on an appointment with her PCP. Patient had been speaking with CMA in office and trying to make an appointment. Patient endorses pain to right leg that is intermittent in nature. Patient requests to get information from [facility] about appointment. Patient states that she needs extra time to set up transportation to any appointment. Patient verbalizes understanding of plan and all questions answered.    Copied from CRM [id number]. Topic: Clinical - Red Word Triage >> May 06, 2023 10:47 AM Robinson DEL wrote: [facility] that prompted transfer to Nurse Triage: Knot in right leg, if on her feet for a long period of time leg seems to swell and painful where knot is and seems to get bigger. Sore and aching Reason for Disposition  [1] MILD pain (e.g., does not interfere with normal activities) AND [2] present > 7 days  Answer Assessment - Initial Assessment Questions 1. ONSET: When did the pain start?      Noticed about a month ago 2. LOCATION: Where is the pain located?      Right side of calf 3. PAIN: How bad is the pain?    (Scale 1-10; or mild, moderate, severe)   -  MILD (1-3): doesn't interfere with normal activities    -  MODERATE (4-7): interferes with normal activities (e.g., work or school) or awakens from sleep, limping    -  SEVERE (8-10): excruciating pain, unable to do any normal activities, unable to walk     Mild pain 4. WORK OR EXERCISE: Has there been any recent work or exercise that involved this part of the body?      No recent work or exercise 5. CAUSE:  What do you think is causing the leg pain?     unsure 6. OTHER SYMPTOMS: Do you have any other symptoms? (e.g., chest pain, back pain, breathing difficulty, swelling, rash, fever, numbness, weakness)     No  Protocols used: Leg Pain-A-AH

## 2023-05-08 NOTE — Telephone Encounter (Signed)
 Spoke with patient, she is scheduled for 01/23.

## 2023-05-16 ENCOUNTER — Encounter: Payer: Self-pay | Admitting: Internal Medicine

## 2023-05-16 ENCOUNTER — Other Ambulatory Visit: Payer: Self-pay | Admitting: Internal Medicine

## 2023-05-16 ENCOUNTER — Ambulatory Visit: Payer: 59 | Admitting: Internal Medicine

## 2023-05-16 ENCOUNTER — Ambulatory Visit: Payer: 59

## 2023-05-16 VITALS — BP 134/80 | HR 75 | Temp 97.8°F | Ht 62.0 in | Wt 170.2 lb

## 2023-05-16 DIAGNOSIS — M7989 Other specified soft tissue disorders: Secondary | ICD-10-CM

## 2023-05-16 DIAGNOSIS — Z23 Encounter for immunization: Secondary | ICD-10-CM | POA: Diagnosis not present

## 2023-05-16 DIAGNOSIS — M170 Bilateral primary osteoarthritis of knee: Secondary | ICD-10-CM

## 2023-05-16 DIAGNOSIS — Z1231 Encounter for screening mammogram for malignant neoplasm of breast: Secondary | ICD-10-CM | POA: Insufficient documentation

## 2023-05-16 DIAGNOSIS — B002 Herpesviral gingivostomatitis and pharyngotonsillitis: Secondary | ICD-10-CM | POA: Diagnosis not present

## 2023-05-16 DIAGNOSIS — S99921A Unspecified injury of right foot, initial encounter: Secondary | ICD-10-CM

## 2023-05-16 DIAGNOSIS — I10 Essential (primary) hypertension: Secondary | ICD-10-CM

## 2023-05-16 DIAGNOSIS — E785 Hyperlipidemia, unspecified: Secondary | ICD-10-CM

## 2023-05-16 DIAGNOSIS — E039 Hypothyroidism, unspecified: Secondary | ICD-10-CM

## 2023-05-16 DIAGNOSIS — M79661 Pain in right lower leg: Secondary | ICD-10-CM | POA: Insufficient documentation

## 2023-05-16 DIAGNOSIS — R7989 Other specified abnormal findings of blood chemistry: Secondary | ICD-10-CM

## 2023-05-16 LAB — CBC WITH DIFFERENTIAL/PLATELET
Basophils Absolute: 0.1 10*3/uL (ref 0.0–0.1)
Basophils Relative: 0.8 % (ref 0.0–3.0)
Eosinophils Absolute: 0.2 10*3/uL (ref 0.0–0.7)
Eosinophils Relative: 2.9 % (ref 0.0–5.0)
HCT: 43.7 % (ref 36.0–46.0)
Hemoglobin: 14.5 g/dL (ref 12.0–15.0)
Lymphocytes Relative: 25.9 % (ref 12.0–46.0)
Lymphs Abs: 1.7 10*3/uL (ref 0.7–4.0)
MCHC: 33.1 g/dL (ref 30.0–36.0)
MCV: 92.3 fL (ref 78.0–100.0)
Monocytes Absolute: 0.5 10*3/uL (ref 0.1–1.0)
Monocytes Relative: 7.5 % (ref 3.0–12.0)
Neutro Abs: 4.2 10*3/uL (ref 1.4–7.7)
Neutrophils Relative %: 62.9 % (ref 43.0–77.0)
Platelets: 402 10*3/uL — ABNORMAL HIGH (ref 150.0–400.0)
RBC: 4.73 Mil/uL (ref 3.87–5.11)
RDW: 14.1 % (ref 11.5–15.5)
WBC: 6.6 10*3/uL (ref 4.0–10.5)

## 2023-05-16 LAB — HEPATIC FUNCTION PANEL
ALT: 12 U/L (ref 0–35)
AST: 17 U/L (ref 0–37)
Albumin: 4.3 g/dL (ref 3.5–5.2)
Alkaline Phosphatase: 72 U/L (ref 39–117)
Bilirubin, Direct: 0.1 mg/dL (ref 0.0–0.3)
Total Bilirubin: 0.6 mg/dL (ref 0.2–1.2)
Total Protein: 6.9 g/dL (ref 6.0–8.3)

## 2023-05-16 LAB — FOLATE: Folate: 18.1 ng/mL (ref >5.9)

## 2023-05-16 LAB — VITAMIN B12: Vitamin B-12: 803 pg/mL (ref 211–911)

## 2023-05-16 LAB — MAGNESIUM: Magnesium: 2 mg/dL (ref 1.5–2.5)

## 2023-05-16 LAB — CK: Total CK: 83 U/L (ref 7–177)

## 2023-05-16 LAB — D-DIMER, QUANTITATIVE: D-Dimer, Quant: 0.29 ug{FEU}/mL (ref <0.50)

## 2023-05-16 LAB — TSH: TSH: 3.9 u[IU]/mL (ref 0.35–5.50)

## 2023-05-16 MED ORDER — VALACYCLOVIR HCL 500 MG PO TABS: 500.0000 mg | ORAL_TABLET | Freq: Every day | ORAL | 1 refills | Status: AC

## 2023-05-16 MED ORDER — FLUOCINONIDE 0.05 % EX CREA
1.0000 | TOPICAL_CREAM | CUTANEOUS | 0 refills | Status: DC | PRN
  Filled 2023-05-16: qty 30, 30d supply, fill #0

## 2023-05-16 NOTE — Progress Notes (Signed)
Subjective:  Patient ID: Theresa Nichols, female    DOB: 1955/07/31  Age: 68 y.o. MRN: [id number]  CC: Osteoarthritis and Hypertension   HPI Theresa Nichols presents for f/up -----  Discussed the use of AI scribe software for clinical note transcription with the patient, who gave verbal consent to proceed.  History of Present Illness   The patient, with a history of leg and knee problems following an accident, presents with a new onset of discomfort in the right lower leg that began approximately two months ago. The discomfort was first noticed while cleaning the bathtub, although no specific injury or fall was recalled. The patient describes the sensation as a swelling or knot in the lower leg, which worsens with prolonged standing or use of an elliptical machine. The discomfort also radiates upwards but recedes when it reaches the upper leg.  The patient denies experiencing cramps during physical activity but reports nocturnal cramps and discomfort when sitting still. The area is described as very sore. For pain management, the patient has been using diclofenac, Tylenol, and Flexeril.  During exercise, the patient denies experiencing chest pain, shortness of breath, dizziness, or lightheadedness. However, she does report occasional fatigue. Additionally, the patient reports a new onset of a rash on the back, which has been causing itchiness.       Outpatient Medications Prior to Visit  Medication Sig Dispense Refill   CALCIUM PO Take by mouth.     cholecalciferol (VITAMIN D) 1000 UNITS tablet Take 1,000 Units by mouth daily.     Coenzyme Q10 (CO Q 10 PO) Take by mouth.     Cyanocobalamin (VITAMIN B 12 PO) Take by mouth.     cyclobenzaprine (FLEXERIL) 5 MG tablet Take 1 tablet (5 mg total) by mouth at bedtime as needed. 30 tablet 0   diclofenac (VOLTAREN) 25 MG EC tablet TAKE 1 TABLET BY MOUTH THREE TIMES DAILY 90 tablet 0   diclofenac Sodium (VOLTAREN) 1 % GEL Apply 2 g  topically 4 (four) times daily. 300 g 1   fluticasone (FLONASE) 50 MCG/ACT nasal spray Place 2 sprays into both nostrils daily. 48 g 1   levothyroxine (SYNTHROID) 75 MCG tablet TAKE 1 TABLET BY MOUTH DAILY BEFORE BREAKFAST 90 tablet 0   loratadine (CLARITIN) 10 MG tablet Take 1 tablet (10 mg total) by mouth daily as needed for allergies. 90 tablet 1   MELATONIN PO Take by mouth.     Multiple Vitamins-Minerals (MULTIVITAMIN & MINERAL PO) Take by mouth daily.     pantoprazole (PROTONIX) 40 MG tablet Take 1 tablet (40 mg total) by mouth daily. 90 tablet 0   Turmeric (QC TUMERIC COMPLEX) 500 MG CAPS Take by mouth.     valACYclovir (VALTREX) 500 MG tablet Take 1 tablet (500 mg total) by mouth daily. 90 tablet 1   amLODipine (NORVASC) 5 MG tablet Take 1 tablet by mouth daily. (Patient not taking: Reported on 05/16/2023)     olmesartan (BENICAR) 20 MG tablet Take 1 tablet (20 mg total) by mouth daily. (Patient not taking: Reported on 05/16/2023) 90 tablet 0   pregabalin (LYRICA) 25 MG capsule Take 1 capsule (25 mg total) by mouth at bedtime. (Patient not taking: Reported on 05/16/2023) 90 capsule 0   rosuvastatin (CRESTOR) 10 MG tablet Take 1 tablet (10 mg total) by mouth daily. (Patient not taking: Reported on 05/16/2023) 90 tablet 1   fluocinonide cream (LIDEX) 0.05 % Apply 1 application topically as needed. (Patient not taking: Reported  on 05/16/2023)     Multiple Vitamins-Minerals (ZINC PO) Take by mouth.     No facility-administered medications prior to visit.    ROS Review of Systems  Constitutional:  Positive for fatigue. Negative for appetite change, diaphoresis and unexpected weight change.  HENT: Negative.    Eyes: Negative.   Respiratory: Negative.  Negative for cough, chest tightness, shortness of breath and wheezing.   Cardiovascular:  Negative for chest pain, palpitations and leg swelling.  Gastrointestinal:  Negative for abdominal pain, constipation, diarrhea, nausea and vomiting.   Genitourinary: Negative.  Negative for difficulty urinating.  Musculoskeletal:  Positive for arthralgias. Negative for back pain, joint swelling and myalgias.  Skin: Negative.   Neurological: Negative.  Negative for dizziness and weakness.  Hematological:  Negative for adenopathy. Does not bruise/bleed easily.    Objective:  BP 134/80 (BP Location: Left Arm, Patient Position: Sitting, Cuff Size: Normal)   Pulse 75   Temp 97.8 F (36.6 C) (Oral)   Ht 5\' 2"  (1.575 m)   Wt 170 lb 3.2 oz (77.2 kg)   SpO2 98%   BMI 31.13 kg/m   BP Readings from Last 3 Encounters:  05/16/23 134/80  03/08/23 (!) 156/83  12/31/22 121/61    Wt Readings from Last 3 Encounters:  05/16/23 170 lb 3.2 oz (77.2 kg)  03/08/23 166 lb (75.3 kg)  12/31/22 172 lb (78 kg)    Physical Exam Constitutional:      Appearance: Normal appearance.  HENT:     Nose: Nose normal.     Mouth/Throat:     Mouth: Mucous membranes are moist.  Eyes:     General: No scleral icterus.    Conjunctiva/sclera: Conjunctivae normal.  Cardiovascular:     Rate and Rhythm: Normal rate and regular rhythm.     Pulses:          Carotid pulses are 1+ on the right side and 1+ on the left side.      Radial pulses are 1+ on the right side and 1+ on the left side.       Femoral pulses are 1+ on the right side and 1+ on the left side.      Popliteal pulses are 1+ on the right side and 1+ on the left side.       Dorsalis pedis pulses are 1+ on the right side and 1+ on the left side.       Posterior tibial pulses are 1+ on the right side and 1+ on the left side.     Heart sounds: Normal heart sounds, S1 normal and S2 normal.     No gallop.     Comments: EKG- NSR, 70 bpm No LVH, Q waves, or ST/T wave changes  Pulmonary:     Effort: Pulmonary effort is normal.     Breath sounds: No stridor. No wheezing, rhonchi or rales.  Abdominal:     General: Abdomen is flat.     Palpations: There is no mass.     Tenderness: There is no abdominal  tenderness. There is no guarding.     Hernia: No hernia is present.  Musculoskeletal:     Right knee: Deformity (DJD) present. No swelling or erythema. Normal range of motion.     Left knee: Deformity (DJD) present. No swelling or erythema. Normal range of motion.     Right lower leg: No swelling. No edema.     Left lower leg: No swelling. No edema.  Right ankle: Deformity (DJD) present. No swelling.     Right Achilles Tendon: Normal.     Left ankle: No swelling or deformity.     Left Achilles Tendon: Normal.     Right foot: Normal. No swelling or deformity.     Left foot: Normal. No swelling or deformity.  Skin:    General: Skin is warm.     Findings: No erythema or rash.  Neurological:     General: No focal deficit present.     Mental Status: She is alert. Mental status is at baseline.  Psychiatric:        Mood and Affect: Mood normal.        Behavior: Behavior normal.     Lab Results  Component Value Date   WBC 6.6 05/16/2023   HGB 14.5 05/16/2023   HCT 43.7 05/16/2023   PLT 402.0 (H) 05/16/2023   GLUCOSE 82 12/12/2022   CHOL 206 (H) 12/12/2022   TRIG 124.0 12/12/2022   HDL 44.20 12/12/2022   LDLCALC 137 (H) 12/12/2022   ALT 12 05/16/2023   AST 17 05/16/2023   NA 140 12/12/2022   K 4.3 12/12/2022   CL 104 12/12/2022   CREATININE 0.96 12/12/2022   BUN 21 12/12/2022   CO2 30 12/12/2022   TSH 3.90 05/16/2023   DG Foot Complete Right Result Date: 05/16/2023 CLINICAL DATA:  Fall and right lower extremity pain. EXAM: RIGHT FOOT COMPLETE - 3+ VIEW; RIGHT TIBIA AND FIBULA - 2 VIEW COMPARISON:  None Available. FINDINGS: There is no acute fracture or dislocation. There is mild arthritic changes of the right knee. The soft tissues are unremarkable. IMPRESSION: 1. No acute fracture or dislocation. 2. Mild arthritic changes of the right knee. Electronically Signed   By: Elgie Collard M.D.   On: 05/16/2023 09:48   DG Tibia/Fibula Right Result Date: 05/16/2023 CLINICAL  DATA:  Fall and right lower extremity pain. EXAM: RIGHT FOOT COMPLETE - 3+ VIEW; RIGHT TIBIA AND FIBULA - 2 VIEW COMPARISON:  None Available. FINDINGS: There is no acute fracture or dislocation. There is mild arthritic changes of the right knee. The soft tissues are unremarkable. IMPRESSION: 1. No acute fracture or dislocation. 2. Mild arthritic changes of the right knee. Electronically Signed   By: Elgie Collard M.D.   On: 05/16/2023 09:48     Assessment & Plan:  Need for immunization against influenza -     Flu Vaccine Trivalent High Dose (Fluad)  Recurrent oral herpes simplex -     valACYclovir HCl; Take 1 tablet (500 mg total) by mouth daily.  Dispense: 90 tablet; Refill: 1  Primary hypertension- Her BP is well controlled. EKG is negative for LVH -     EKG 12-Lead -     CBC with Differential/Platelet; Future -     Magnesium; Future  Pain and swelling of right lower leg -     DG Tibia/Fibula Right; Future -     D-dimer, quantitative; Future -     CK; Future  Injury of right foot, initial encounter -     DG Foot Complete Right; Future  Low vitamin B12 level -     CBC with Differential/Platelet; Future -     Vitamin B12; Future -     Folate; Future  Acquired hypothyroidism - She is euthyroid. -     TSH; Future -     Magnesium; Future  Hyperlipidemia LDL goal <130 - LDL goal achieved. Doing well on the statin  -  Hepatic function panel; Future -     CK; Future -     Magnesium; Future  Screening mammogram for breast cancer -     Digital Screening Mammogram, Left and Right; Future  Primary osteoarthritis of both knees -     Ambulatory referral to Orthopedic Surgery     Follow-up: Return in about 6 months (around 11/13/2023).  Sanda Linger, MD

## 2023-05-16 NOTE — Patient Instructions (Signed)
Hypertension, Adult High blood pressure (hypertension) is when the force of blood pumping through the arteries is too strong. The arteries are the blood vessels that carry blood from the heart throughout the body. Hypertension forces the heart to work harder to pump blood and may cause arteries to become narrow or stiff. Untreated or uncontrolled hypertension can lead to a heart attack, heart failure, a stroke, kidney disease, and other problems. A blood pressure reading consists of a higher number over a lower number. Ideally, your blood pressure should be below 120/80. The first ("top") number is called the systolic pressure. It is a measure of the pressure in your arteries as your heart beats. The second ("bottom") number is called the diastolic pressure. It is a measure of the pressure in your arteries as the heart relaxes. What are the causes? The exact cause of this condition is not known. There are some conditions that result in high blood pressure. What increases the risk? Certain factors may make you more likely to develop high blood pressure. Some of these risk factors are under your control, including: Smoking. Not getting enough exercise or physical activity. Being overweight. Having too much fat, sugar, calories, or salt (sodium) in your diet. Drinking too much alcohol. Other risk factors include: Having a personal history of heart disease, diabetes, high cholesterol, or kidney disease. Stress. Having a family history of high blood pressure and high cholesterol. Having obstructive sleep apnea. Age. The risk increases with age. What are the signs or symptoms? High blood pressure may not cause symptoms. Very high blood pressure (hypertensive crisis) may cause: Headache. Fast or irregular heartbeats (palpitations). Shortness of breath. Nosebleed. Nausea and vomiting. Vision changes. Severe chest pain, dizziness, and seizures. How is this diagnosed? This condition is diagnosed by  measuring your blood pressure while you are seated, with your arm resting on a flat surface, your legs uncrossed, and your feet flat on the floor. The cuff of the blood pressure monitor will be placed directly against the skin of your upper arm at the level of your heart. Blood pressure should be measured at least twice using the same arm. Certain conditions can cause a difference in blood pressure between your right and left arms. If you have a high blood pressure reading during one visit or you have normal blood pressure with other risk factors, you may be asked to: Return on a different day to have your blood pressure checked again. Monitor your blood pressure at home for 1 week or longer. If you are diagnosed with hypertension, you may have other blood or imaging tests to help your health care provider understand your overall risk for other conditions. How is this treated? This condition is treated by making healthy lifestyle changes, such as eating healthy foods, exercising more, and reducing your alcohol intake. You may be referred for counseling on a healthy diet and physical activity. Your health care provider may prescribe medicine if lifestyle changes are not enough to get your blood pressure under control and if: Your systolic blood pressure is above 130. Your diastolic blood pressure is above 80. Your personal target blood pressure may vary depending on your medical conditions, your age, and other factors. Follow these instructions at home: Eating and drinking  Eat a diet that is high in fiber and potassium, and low in sodium, added sugar, and fat. An example of this eating plan is called the DASH diet. DASH stands for Dietary Approaches to Stop Hypertension. To eat this way: Eat   plenty of fresh fruits and vegetables. Try to fill one half of your plate at each meal with fruits and vegetables. Eat whole grains, such as whole-wheat pasta, brown rice, or whole-grain bread. Fill about one  fourth of your plate with whole grains. Eat or drink low-fat dairy products, such as skim milk or low-fat yogurt. Avoid fatty cuts of meat, processed or cured meats, and poultry with skin. Fill about one fourth of your plate with lean proteins, such as fish, chicken without skin, beans, eggs, or tofu. Avoid pre-made and processed foods. These tend to be higher in sodium, added sugar, and fat. Reduce your daily sodium intake. Many people with hypertension should eat less than 1,500 mg of sodium a day. Do not drink alcohol if: Your health care provider tells you not to drink. You are pregnant, may be pregnant, or are planning to become pregnant. If you drink alcohol: Limit how much you have to: 0-1 drink a day for women. 0-2 drinks a day for men. Know how much alcohol is in your drink. In the [place] one drink equals one 12 oz bottle of beer (355 mL), one 5 oz glass of wine (148 mL), or one 1 oz glass of hard liquor (44 mL). Lifestyle  Work with your health care provider to maintain a healthy body weight or to lose weight. Ask what an ideal weight is for you. Get at least 30 minutes of exercise that causes your heart to beat faster (aerobic exercise) most days of the week. Activities may include walking, swimming, or biking. Include exercise to strengthen your muscles (resistance exercise), such as Pilates or lifting weights, as part of your weekly exercise routine. Try to do these types of exercises for 30 minutes at least 3 days a week. Do not use any products that contain nicotine or tobacco. These products include cigarettes, chewing tobacco, and vaping devices, such as e-cigarettes. If you need help quitting, ask your health care provider. Monitor your blood pressure at home as told by your health care provider. Keep all follow-up visits. This is important. Medicines Take over-the-counter and prescription medicines only as told by your health care provider. Follow directions carefully. Blood  pressure medicines must be taken as prescribed. Do not skip doses of blood pressure medicine. Doing this puts you at risk for problems and can make the medicine less effective. Ask your health care provider about side effects or reactions to medicines that you should watch for. Contact a health care provider if you: Think you are having a reaction to a medicine you are taking. Have headaches that keep coming back (recurring). Feel dizzy. Have swelling in your ankles. Have trouble with your vision. Get help right away if you: Develop a severe headache or confusion. Have unusual weakness or numbness. Feel faint. Have severe pain in your chest or abdomen. Vomit repeatedly. Have trouble breathing. These symptoms may be an emergency. Get help right away. Call 911. Do not wait to see if the symptoms will go away. Do not drive yourself to the hospital. Summary Hypertension is when the force of blood pumping through your arteries is too strong. If this condition is not controlled, it may put you at risk for serious complications. Your personal target blood pressure may vary depending on your medical conditions, your age, and other factors. For most people, a normal blood pressure is less than 120/80. Hypertension is treated with lifestyle changes, medicines, or a combination of both. Lifestyle changes include losing weight, eating a healthy,   low-sodium diet, exercising more, and limiting alcohol. This information is not intended to replace advice given to you by your health care provider. Make sure you discuss any questions you have with your health care provider. Document Revised: 02/14/2021 Document Reviewed: 02/14/2021 [employer] Patient Education  2024 [employer]  

## 2023-05-17 ENCOUNTER — Other Ambulatory Visit (HOSPITAL_COMMUNITY): Payer: Self-pay

## 2023-05-17 ENCOUNTER — Other Ambulatory Visit: Payer: Self-pay

## 2023-05-30 ENCOUNTER — Other Ambulatory Visit: Payer: Self-pay | Admitting: Internal Medicine

## 2023-05-30 DIAGNOSIS — M150 Primary generalized (osteo)arthritis: Secondary | ICD-10-CM

## 2023-05-30 DIAGNOSIS — M545 Low back pain, unspecified: Secondary | ICD-10-CM

## 2023-05-30 DIAGNOSIS — M170 Bilateral primary osteoarthritis of knee: Secondary | ICD-10-CM

## 2023-06-09 ENCOUNTER — Other Ambulatory Visit: Payer: Self-pay | Admitting: Internal Medicine

## 2023-06-09 DIAGNOSIS — E039 Hypothyroidism, unspecified: Secondary | ICD-10-CM

## 2023-06-17 ENCOUNTER — Ambulatory Visit: Payer: 59 | Admitting: Internal Medicine

## 2023-06-27 ENCOUNTER — Encounter: Payer: Self-pay | Admitting: Internal Medicine

## 2023-07-03 ENCOUNTER — Other Ambulatory Visit: Payer: Self-pay | Admitting: Internal Medicine

## 2023-07-03 ENCOUNTER — Encounter: Payer: Self-pay | Admitting: Internal Medicine

## 2023-07-03 DIAGNOSIS — R911 Solitary pulmonary nodule: Secondary | ICD-10-CM

## 2023-07-08 ENCOUNTER — Other Ambulatory Visit: Payer: Self-pay | Admitting: Internal Medicine

## 2023-07-08 DIAGNOSIS — R911 Solitary pulmonary nodule: Secondary | ICD-10-CM

## 2023-07-08 DIAGNOSIS — R918 Other nonspecific abnormal finding of lung field: Secondary | ICD-10-CM | POA: Insufficient documentation

## 2023-07-15 ENCOUNTER — Other Ambulatory Visit: Payer: Self-pay | Admitting: Internal Medicine

## 2023-07-15 MED ORDER — PANTOPRAZOLE SODIUM 40 MG PO TBEC: 40.0000 mg | DELAYED_RELEASE_TABLET | Freq: Every day | ORAL | 0 refills | Status: DC

## 2023-08-05 ENCOUNTER — Ambulatory Visit
Admission: RE | Admit: 2023-08-05 | Discharge: 2023-08-05 | Disposition: A | Discharge status: Home / Self Care | Source: Ambulatory Visit | Attending: Internal Medicine | Admitting: Internal Medicine

## 2023-08-05 DIAGNOSIS — R918 Other nonspecific abnormal finding of lung field: Secondary | ICD-10-CM

## 2023-08-14 ENCOUNTER — Encounter: Payer: Self-pay | Admitting: Internal Medicine
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
## 2023-09-02 ENCOUNTER — Ambulatory Visit (INDEPENDENT_AMBULATORY_CARE_PROVIDER_SITE_OTHER): Admitting: Family Medicine

## 2023-09-02 ENCOUNTER — Ambulatory Visit: Payer: Self-pay

## 2023-09-02 VITALS — BP 126/78 | HR 74 | Temp 97.7°F | Resp 18 | Ht 62.0 in | Wt 174.0 lb

## 2023-09-02 DIAGNOSIS — R300 Dysuria: Secondary | ICD-10-CM | POA: Diagnosis not present

## 2023-09-02 DIAGNOSIS — N3001 Acute cystitis with hematuria: Secondary | ICD-10-CM

## 2023-09-02 LAB — POCT URINALYSIS DIPSTICK
Bilirubin, UA: NEGATIVE
Blood, UA: POSITIVE
Glucose, UA: NEGATIVE
Ketones, UA: NEGATIVE
Nitrite, UA: NEGATIVE
Protein, UA: POSITIVE — AB
Spec Grav, UA: 1.01 (ref 1.010–1.025)
Urobilinogen, UA: NEGATIVE U/dL — AB
pH, UA: 6 (ref 5.0–8.0)

## 2023-09-02 MED ORDER — CEFDINIR 300 MG PO CAPS: 300.0000 mg | ORAL_CAPSULE | Freq: Two times a day (BID) | ORAL | 0 refills | Status: AC

## 2023-09-02 MED ORDER — PHENAZOPYRIDINE HCL 200 MG PO TABS: 200.0000 mg | ORAL_TABLET | Freq: Three times a day (TID) | ORAL | 0 refills | Status: AC | PRN

## 2023-09-02 NOTE — Progress Notes (Signed)
 Assessment & Plan:  1. Acute cystitis with hematuria (Primary) Education provided on UTIs. Encouraged adequate hydration.  - POCT urinalysis dipstick - Urine Culture; Future - cefdinir (OMNICEF) 300 MG capsule; Take 1 capsule (300 mg total) by mouth 2 (two) times daily for 7 days.  Dispense: 14 capsule; Refill: 0 - phenazopyridine (PYRIDIUM) 200 MG tablet; Take 1 tablet (200 mg total) by mouth 3 (three) times daily as needed for up to 3 days for pain.  Dispense: 10 tablet; Refill: 0   Follow up plan: Return if symptoms worsen or fail to improve.  Hershel Los, MSN, APRN, FNP-C  Subjective:  HPI: Theresa Nichols is a 68 y.o. female presenting on 09/02/2023 for Urinary Tract Infection (Started SAT - s/s: tingle sensation, burning with urination, some blood in urine, frequent and urgency, decreased output. )  Patient complains of dysuria, frequency, hematuria, urgency, and decreased urine output. She has had symptoms for 2 days. Patient does have a history of recurrent UTI.  Patient does not have a history of pyelonephritis.     ROS: Negative unless specifically indicated above in HPI.   Relevant past medical history reviewed and updated as indicated.   Allergies and medications reviewed and updated.   Current Outpatient Medications:    CALCIUM  PO, Take by mouth., Disp: , Rfl:    cholecalciferol (VITAMIN D ) 1000 UNITS tablet, Take 1,000 Units by mouth daily., Disp: , Rfl:    Coenzyme Q10 (CO Q 10 PO), Take by mouth., Disp: , Rfl:    Cyanocobalamin  (VITAMIN B 12 PO), Take by mouth., Disp: , Rfl:    diclofenac  (VOLTAREN ) 25 MG EC tablet, TAKE 1 TABLET BY MOUTH THREE TIMES DAILY, Disp: 90 tablet, Rfl: 2   diclofenac  Sodium (VOLTAREN ) 1 % GEL, Apply 2 g topically 4 (four) times daily., Disp: 300 g, Rfl: 1   fluticasone  (FLONASE ) 50 MCG/ACT nasal spray, Place 2 sprays into both nostrils daily., Disp: 48 g, Rfl: 1   levothyroxine  (SYNTHROID ) 75 MCG tablet, TAKE 1 TABLET BY MOUTH  ONCE DAILY BEFORE BREAKFAST, Disp: 90 tablet, Rfl: 0   loratadine  (CLARITIN ) 10 MG tablet, Take 1 tablet (10 mg total) by mouth daily as needed for allergies., Disp: 90 tablet, Rfl: 1   MELATONIN PO, Take by mouth., Disp: , Rfl:    Multiple Vitamins-Minerals (MULTIVITAMIN & MINERAL PO), Take by mouth daily., Disp: , Rfl:    pantoprazole  (PROTONIX ) 40 MG tablet, Take 1 tablet (40 mg total) by mouth daily., Disp: 90 tablet, Rfl: 0   Turmeric (QC TUMERIC COMPLEX) 500 MG CAPS, Take by mouth., Disp: , Rfl:    UNABLE TO FIND, Med Name: weight loss clinic - non RX for weight loss - compounded Trizepitide or semuglutide weekly, Disp: , Rfl:    cyclobenzaprine  (FLEXERIL ) 5 MG tablet, Take 1 tablet (5 mg total) by mouth at bedtime as needed. (Patient not taking: Reported on 09/02/2023), Disp: 30 tablet, Rfl: 0   fluocinonide  cream (LIDEX ) 0.05 %, Apply 1 Application topically as needed. (Patient not taking: Reported on 09/02/2023), Disp: 30 g, Rfl: 0   valACYclovir  (VALTREX ) 500 MG tablet, Take 1 tablet (500 mg total) by mouth daily. (Patient not taking: Reported on 09/02/2023), Disp: 90 tablet, Rfl: 1  Allergies  Allergen Reactions   Maxalt  [Rizatriptan ] Other (See Comments)    Blurred vision   Topamax  [Topiramate ] Other (See Comments)    Blurred vision    Objective:   BP 126/78   Pulse 74   Temp 97.7  F (36.5 C)   Resp 18   Ht 5\' 2"  (1.575 m)   Wt 174 lb (78.9 kg)   BMI 31.83 kg/m    Physical Exam Vitals reviewed.  Constitutional:      General: She is not in acute distress.    Appearance: Normal appearance. She is not ill-appearing, toxic-appearing or diaphoretic.  HENT:     Head: Normocephalic and atraumatic.  Eyes:     General: No scleral icterus.       Right eye: No discharge.        Left eye: No discharge.     Conjunctiva/sclera: Conjunctivae normal.  Cardiovascular:     Rate and Rhythm: Normal rate.  Pulmonary:     Effort: Pulmonary effort is normal. No respiratory distress.   Abdominal:     Tenderness: There is no right CVA tenderness or left CVA tenderness.  Musculoskeletal:        General: Normal range of motion.     Cervical back: Normal range of motion.  Skin:    General: Skin is warm and dry.     Capillary Refill: Capillary refill takes less than 2 seconds.  Neurological:     General: No focal deficit present.     Mental Status: She is alert and oriented to person, place, and time. Mental status is at baseline.  Psychiatric:        Mood and Affect: Mood normal.        Behavior: Behavior normal.        Thought Content: Thought content normal.        Judgment: Judgment normal.

## 2023-09-02 NOTE — Telephone Encounter (Signed)
  Chief Complaint: Suspected UTI Symptoms: burning urine, blood in urine, increased urine frequency Frequency: since Sunday Pertinent Negatives: Patient denies fever Disposition: [] ED /[] Urgent Care (no appt availability in office) / [x] Appointment(In office/virtual)/ []  [facility]/ [] Home Care/ [] Refused Recommended Disposition /[] [facility]/ []  Follow-up with PCP Additional Notes: Patient called in stating she has had urinary symptoms of burning with urination, blood in urine, and increased frequency since Sunday. The blood in urine showed up this morning. Patient denies fever and back pain.    Copied from CRM [id number]. Topic: Clinical - Red Word Triage >> Sep 02, 2023 10:19 AM Corin V wrote: [facility] that prompted transfer to Nurse Triage: Patient noticed yesterday that she was having irritation when urinating. Today she noticed there was blood in the urine. She is having significant pain as well. Reason for Disposition  Urinating more frequently than usual (i.e., frequency)  Answer Assessment - Initial Assessment Questions 1. SYMPTOM: "What's the main symptom you're concerned about?" (e.g., frequency, incontinence)     Blood in urine 2. ONSET: "When did the  symptoms  start?"     Yesterday morning 3. PAIN: "Is there any pain?" If Yes, ask: "How bad is it?" (Scale: 1-10; mild, moderate, severe)     8-9 when urinating 4. CAUSE: "What do you think is causing the symptoms?"     Suspected UTI 5. OTHER SYMPTOMS: "Do you have any other symptoms?" (e.g., blood in urine, fever, flank pain, pain with urination)     Pain with urination  Protocols used: Urinary Symptoms-A-AH

## 2023-09-02 NOTE — Patient Instructions (Signed)
 Username: [email or web address]   Password: [email or web address]

## 2023-09-03 LAB — URINE CULTURE

## 2023-09-06 ENCOUNTER — Ambulatory Visit: Payer: 59 | Admitting: Internal Medicine

## 2023-09-06 DIAGNOSIS — M150 Primary generalized (osteo)arthritis: Secondary | ICD-10-CM

## 2023-09-06 DIAGNOSIS — I10 Essential (primary) hypertension: Secondary | ICD-10-CM

## 2023-09-06 DIAGNOSIS — I7300 Raynaud's syndrome without gangrene: Secondary | ICD-10-CM

## 2023-09-11 ENCOUNTER — Other Ambulatory Visit: Payer: Self-pay | Admitting: Internal Medicine

## 2023-09-15 ENCOUNTER — Other Ambulatory Visit: Payer: Self-pay | Admitting: Internal Medicine

## 2023-09-20 ENCOUNTER — Ambulatory Visit: Payer: Self-pay

## 2023-09-20 NOTE — Telephone Encounter (Signed)
 Please advise. She needs another appointment

## 2023-09-20 NOTE — Telephone Encounter (Signed)
 Patient has been scheduled

## 2023-09-20 NOTE — Telephone Encounter (Signed)
 Pt reports she was seen in the office 5/12 and was diagnosed with a UTI. Pt was prescribed cefdinir  and pyridium . Pt finished the cefdinir  on 5/19 and states she felt better. Then, today developed dysuria again. Pt reports 10/10 pain with urination and endorses minimal output when she does urinate. Pt denies fever, chills, vomiting, abdominal pain, back pain, or flank pain. Pt states when she wiped today she noticed it was a "little pink", but pt states when she came in on 5/12 her urine appeared very bloody. Pt requesting a different antibiotic be sent to her pharmacy. RN advised pt RN would relay concern to the office. RN advised pt if she notices bloody urine with clots, vomiting, abdominal pain, flank pain, back pain, or fever she needs to go to the ED. Pt verbalized understanding.   Copied from CRM [id number]. Topic: Clinical - Red Word Triage >> Sep 20, 2023 12:40 PM [employer] wrote: [facility] Word that prompted transfer to Nurse Triage: UTI meds finished but hurts really bad to urinate. Answer Assessment - Initial Assessment Questions 1. MAIN SYMPTOM: "What is the main symptom you are concerned about?" (e.g., painful urination, urine frequency)     Dysuria; pt was seen 5/12 and prescribed cefdinir  & pyridium  2. BETTER-SAME-WORSE: "Are you getting better, staying the same, or getting worse compared to how you felt at your last visit to the doctor (most recent medical visit)?"     Pt states her symptoms improved, but now she has pain again starting today 3. PAIN: "How bad is the pain?"  (e.g., Scale 1-10; mild, moderate, or severe)   - MILD (1-3): complains slightly about urination hurting   - MODERATE (4-7): interferes with normal activities     - SEVERE (8-10): excruciating, unwilling or unable to urinate because of the pain      10/10 with urinating 4. FEVER: "Do you have a fever?" If Yes, ask: "What is it, how was it measured, and when did it start?"     no 5. OTHER SYMPTOMS: "Do you have any other  symptoms?" (e.g., blood in the urine, flank pain, vaginal discharge)     "When I wiped it was a little pink", "when I came in the other time it was bloody" 6. DIAGNOSIS: "When was the UTI diagnosed?" "By whom?" "Was it a kidney infection, bladder infection or both?"     5/12 at [facility] 7. ANTIBIOTIC: "What antibiotic(s) are you taking?" "How many times per day?"     Cefdinir   8. ANTIBIOTIC - START DATE: "When did you start taking the antibiotic?"     5/12-5/19  Protocols used: Urinary Tract Infection on Antibiotic Follow-up Call - [facility] [place]

## 2023-09-23 ENCOUNTER — Ambulatory Visit: Admitting: Family Medicine

## 2023-09-24 ENCOUNTER — Ambulatory Visit: Admitting: Family Medicine

## 2023-09-27 DIAGNOSIS — L300 Nummular dermatitis: Secondary | ICD-10-CM | POA: Diagnosis not present

## 2023-09-27 DIAGNOSIS — L821 Other seborrheic keratosis: Secondary | ICD-10-CM | POA: Diagnosis not present

## 2023-09-28 ENCOUNTER — Other Ambulatory Visit: Payer: Self-pay | Admitting: Internal Medicine

## 2023-09-28 DIAGNOSIS — J301 Allergic rhinitis due to pollen: Secondary | ICD-10-CM

## 2023-11-04 ENCOUNTER — Ambulatory Visit (HOSPITAL_COMMUNITY)
Admission: RE | Admit: 2023-11-04 | Discharge: 2023-11-04 | Disposition: A | Discharge status: Home / Self Care | Source: Ambulatory Visit | Attending: Internal Medicine | Admitting: Internal Medicine

## 2023-11-04 ENCOUNTER — Other Ambulatory Visit: Payer: Self-pay | Admitting: Internal Medicine

## 2023-11-04 DIAGNOSIS — Z1231 Encounter for screening mammogram for malignant neoplasm of breast: Secondary | ICD-10-CM | POA: Diagnosis not present

## 2023-11-08 ENCOUNTER — Other Ambulatory Visit (HOSPITAL_COMMUNITY): Payer: Self-pay | Admitting: Internal Medicine

## 2023-11-08 DIAGNOSIS — R928 Other abnormal and inconclusive findings on diagnostic imaging of breast: Secondary | ICD-10-CM

## 2023-11-12 ENCOUNTER — Other Ambulatory Visit: Payer: Self-pay

## 2023-11-12 ENCOUNTER — Other Ambulatory Visit (HOSPITAL_COMMUNITY): Payer: Self-pay

## 2023-11-12 MED ORDER — FLUOCINONIDE 0.05 % EX CREA
1.0000 | TOPICAL_CREAM | CUTANEOUS | 0 refills | Status: AC | PRN
  Filled 2023-11-12: qty 30, 30d supply, fill #0

## 2023-11-18 ENCOUNTER — Encounter: Payer: Self-pay | Admitting: Internal Medicine

## 2023-11-18 ENCOUNTER — Ambulatory Visit (INDEPENDENT_AMBULATORY_CARE_PROVIDER_SITE_OTHER): Payer: 59 | Admitting: Internal Medicine

## 2023-11-18 VITALS — BP 138/83 | HR 77 | Temp 97.4°F | Ht 62.0 in | Wt 173.0 lb

## 2023-11-18 DIAGNOSIS — E039 Hypothyroidism, unspecified: Secondary | ICD-10-CM

## 2023-11-18 DIAGNOSIS — M545 Low back pain, unspecified: Secondary | ICD-10-CM

## 2023-11-18 DIAGNOSIS — N3281 Overactive bladder: Secondary | ICD-10-CM | POA: Insufficient documentation

## 2023-11-18 DIAGNOSIS — E785 Hyperlipidemia, unspecified: Secondary | ICD-10-CM

## 2023-11-18 DIAGNOSIS — G8929 Other chronic pain: Secondary | ICD-10-CM

## 2023-11-18 DIAGNOSIS — K2100 Gastro-esophageal reflux disease with esophagitis, without bleeding: Secondary | ICD-10-CM

## 2023-11-18 DIAGNOSIS — M170 Bilateral primary osteoarthritis of knee: Secondary | ICD-10-CM

## 2023-11-18 DIAGNOSIS — R1312 Dysphagia, oropharyngeal phase: Secondary | ICD-10-CM | POA: Insufficient documentation

## 2023-11-18 DIAGNOSIS — M150 Primary generalized (osteo)arthritis: Secondary | ICD-10-CM

## 2023-11-18 LAB — CBC WITH DIFFERENTIAL/PLATELET
Basophils Absolute: 0 K/uL (ref 0.0–0.1)
Basophils Relative: 0.7 % (ref 0.0–3.0)
Eosinophils Absolute: 0.1 K/uL (ref 0.0–0.7)
Eosinophils Relative: 1.8 % (ref 0.0–5.0)
HCT: 42.8 % (ref 36.0–46.0)
Hemoglobin: 14 g/dL (ref 12.0–15.0)
Lymphocytes Relative: 26.7 % (ref 12.0–46.0)
Lymphs Abs: 1.6 K/uL (ref 0.7–4.0)
MCHC: 32.6 g/dL (ref 30.0–36.0)
MCV: 91.2 fl (ref 78.0–100.0)
Monocytes Absolute: 0.4 K/uL (ref 0.1–1.0)
Monocytes Relative: 7.5 % (ref 3.0–12.0)
Neutro Abs: 3.8 K/uL (ref 1.4–7.7)
Neutrophils Relative %: 63.3 % (ref 43.0–77.0)
Platelets: 364 K/uL (ref 150.0–400.0)
RBC: 4.69 Mil/uL (ref 3.87–5.11)
RDW: 14.4 % (ref 11.5–15.5)
WBC: 6 K/uL (ref 4.0–10.5)

## 2023-11-18 LAB — BASIC METABOLIC PANEL WITH GFR
BUN: 21 mg/dL (ref 6–23)
CO2: 28 meq/L (ref 19–32)
Calcium: 9.7 mg/dL (ref 8.4–10.5)
Chloride: 104 meq/L (ref 96–112)
Creatinine, Ser: 0.84 mg/dL (ref 0.40–1.20)
GFR: 72.24 mL/min (ref >60.00)
Glucose, Bld: 92 mg/dL (ref 70–99)
Potassium: 4.4 meq/L (ref 3.5–5.1)
Sodium: 140 meq/L (ref 135–145)

## 2023-11-18 LAB — LIPID PANEL
Cholesterol: 215 mg/dL — ABNORMAL HIGH (ref 0–200)
HDL: 52.6 mg/dL (ref >39.00)
LDL Cholesterol: 147 mg/dL — ABNORMAL HIGH (ref 0–99)
NonHDL: 162.49
Total CHOL/HDL Ratio: 4
Triglycerides: 79 mg/dL (ref 0.0–149.0)
VLDL: 15.8 mg/dL (ref 0.0–40.0)

## 2023-11-18 MED ORDER — VOQUEZNA 20 MG PO TABS
1.0000 | ORAL_TABLET | Freq: Every day | ORAL | Status: DC
Start: 2023-11-18 — End: 2023-12-16

## 2023-11-18 NOTE — Patient Instructions (Signed)
 GERD in Adults: What to Know  Gastroesophageal reflux (GER) is when acid from your stomach flows up into your esophagus. Your esophagus is the part of your body that moves food from your mouth to your stomach. Normally, food goes down and stays in your stomach to be digested. But with GER, food and stomach acid may go back up. You may have a disease called gastroesophageal reflux disease (GERD) if the reflux: Happens often. Causes very bad symptoms. Makes your esophagus sore and swollen. Over time, GERD can make small holes called ulcers in the lining of your esophagus. What are the causes? GERD is caused by a problem with the muscle between your esophagus and stomach. This muscle is called the lower esophageal sphincter (LES). When it's weak or not normal, it doesn't close like it should. This means food and stomach acid can go back up into your esophagus. The muscle can be weak if: You smoke or use products with tobacco in them. You're pregnant. You have a type of hernia called a hiatal hernia. You eat certain foods and drinks. These include: Alcohol. Coffee. Chocolate. Onions. Peppermint. What increases the risk? Being overweight. Having a disease that affects your connective tissue. Taking NSAIDs, such as ibuprofen. What are the signs or symptoms? Heartburn. Trouble swallowing. Pain when you swallow. The feeling of having a lump in your throat. A bitter taste in your mouth. Bad breath. Having an upset or bloated stomach. Burping. Chest pain. Other conditions can also cause chest pain. Make sure you see your health care provider if you have chest pain. Wheezing. This is when you make high-pitched whistling sounds when you breathe, most often when you breathe out. A long-term cough or a cough at night. How is this diagnosed? GERD may be diagnosed based on your medical history and a physical exam. You may also have tests. These may include: An endoscopy. This test looks at your  stomach and esophagus with a small camera. A barium swallow test. This shows the shape and size of your esophagus and how well it's working. Tests of your esophagus to check for: Acid levels. Pressure. How is this treated? Treatment may depend on how bad your symptoms are. It may include: Changes to your diet and daily life. Medicines. Surgery. Follow these instructions at home: Eating and drinking Follow an eating plan as told by your provider. You may need to avoid certain foods and drinks. These may include: Coffee and tea, with or without caffeine. Alcohol. Energy drinks and sports drinks. Fizzy drinks or sodas. Chocolate and cocoa. Peppermint and mint flavorings. Garlic and onions. Horseradish. Spicy and acidic foods. These include: Peppers. Chili powder and curry powder. Vinegar. Hot sauces and BBQ sauce. Citrus fruits and juices. These include: Oranges. Lemons. Limes. Tomato-based foods. These include: Red sauce and pizza with red sauce. Chili. Salsa. Fried and fatty foods. These include: Donuts. [place] fries. Potato chips. High-fat dressings. High-fat meats. These include: Hot dogs and sausage. Rib eye steak. Ham and bacon. High-fat dairy items. These include: Whole milk. Butter. Cream cheese. Eat small meals often. Avoid eating big meals. Avoid drinking lots of liquid with your meals. Try not to eat meals during the 2-3 hours before bedtime. Try not to lie down right after you eat. Do not exercise right after you eat. Lifestyle  If you're overweight, lose an amount of weight that's healthy for you. Ask your provider about a safe weight loss goal. Do not smoke, vape, or use nicotine or tobacco. Wear  loose clothes. Do not wear things that are tight around your waist. When you sleep, try: Raising the head of your bed about 6 inches (15 cm). You can use a wedge to do this. Lying down on your left side. Try to lower your stress. If you need help doing  this, ask your provider. General instructions Take your medicines only as told. Do not take aspirin or ibuprofen unless you're told to. Watch for any changes in your symptoms. Do not bend over if it makes your symptoms worse. Contact a health care provider if: You have new symptoms. You have trouble: Drinking. Swallowing. Eating. It hurts to swallow. You have wheezing. You have a cough that won't go away. Your voice is hoarse. Your symptoms don't get better with treatment. Get help right away if: You have pain all of a sudden in your: Arm. Neck. Jaw. Teeth. Back. You feel sweaty, dizzy, or light-headed all of a sudden. You faint. You have chest pain or shortness of breath. You vomit and the vomit is: Green, yellow, or black. Looks like blood or coffee grounds. Your poop is red, bloody, or black. These symptoms may be an emergency. Call 911 right away. Do not wait to see if the symptoms will go away. Do not drive yourself to the hospital. This information is not intended to replace advice given to you by your health care provider. Make sure you discuss any questions you have with your health care provider. Document Revised: 02/19/2023 Document Reviewed: 09/05/2022 Elsevier Patient Education  2024 [employer].

## 2023-11-18 NOTE — Progress Notes (Unsigned)
 Subjective:  Patient ID: Theresa Nichols, female    DOB: 06/07/1955  Age: 68 y.o. MRN: [id number]  CC: Follow-up (Patient here for 6 month f/u. Patient mentions wanting to go back on RABEprazole  (ACIPHEX ) 20 MG tablet and stop protonix  she states this is not working any more. Patient is also wanting an OBGYN within cone believes her bladder has dropped ), Hypothyroidism, Osteoarthritis, Gastroesophageal Reflux, and Back Pain   HPI Theresa Nichols presents for f/up ----  Discussed the use of AI scribe software for clinical note transcription with the patient, who gave verbal consent to proceed.  History of Present Illness Theresa Nichols is a 68 year old female who presents with urinary incontinence and regurgitation of food.  She experiences urinary incontinence characterized by episodes of feeling wet without the sensation of needing to urinate, and leaking before reaching the bathroom. This has coincided with increased physical activity such as moving furniture and shampooing carpets. She has not been taking any medication for overactive bladder. No new onset of numbness, weakness, or tingling is reported.  She experiences regurgitation of food, particularly with dairy products like ice cream, and certain foods eaten later in the day. She denies odynophagia and has not undergone an EGD or esophageal examination. There is no weight loss or choking, but she notes that food regurgitates. She is currently taking Protonix  but would like to switch back to Aciphex .  She uses diclofenac  cream for pain and takes the pill form when the cream is ineffective. She takes thyroid  medication and reports occasional constipation. She mentions a significant loss of muscle tone in her upper legs following an accident in 2019, which has made it difficult to navigate stairs in her home. She reports tightness and cracking in her neck and back but no radiating pain.    Outpatient Medications Prior to  Visit  Medication Sig Dispense Refill   CALCIUM  PO Take by mouth.     cholecalciferol (VITAMIN D ) 1000 UNITS tablet Take 1,000 Units by mouth daily.     Coenzyme Q10 (CO Q 10 PO) Take by mouth.     Cyanocobalamin  (VITAMIN B 12 PO) Take by mouth.     diclofenac  (VOLTAREN ) 25 MG EC tablet TAKE 1 TABLET BY MOUTH THREE TIMES DAILY 90 tablet 2   diclofenac  Sodium (VOLTAREN ) 1 % GEL Apply 2 g topically 4 (four) times daily. 300 g 1   EQ ALL DAY ALLERGY RELIEF 10 MG tablet TAKE 1 TABLET BY MOUTH ONCE DAILY AS NEEDED FOR ALLERGIES 90 tablet 0   fluocinonide  cream (LIDEX ) 0.05 % Apply 1 Application topically as needed. 30 g 0   fluticasone  (FLONASE ) 50 MCG/ACT nasal spray Place 2 sprays into both nostrils daily. 48 g 1   levothyroxine  (SYNTHROID ) 75 MCG tablet TAKE 1 TABLET BY MOUTH ONCE DAILY BEFORE BREAKFAST 90 tablet 0   MELATONIN PO Take by mouth.     Multiple Vitamins-Minerals (MULTIVITAMIN & MINERAL PO) Take by mouth daily.     Turmeric (QC TUMERIC COMPLEX) 500 MG CAPS Take by mouth.     UNABLE TO FIND Med Name: weight loss clinic - non RX for weight loss - compounded Trizepitide or semuglutide weekly     cyclobenzaprine  (FLEXERIL ) 5 MG tablet Take 1 tablet (5 mg total) by mouth at bedtime as needed. (Patient not taking: Reported on 11/18/2023) 30 tablet 0   valACYclovir  (VALTREX ) 500 MG tablet Take 1 tablet (500 mg total) by mouth daily. (Patient not taking: Reported on  11/18/2023) 90 tablet 1   pantoprazole  (PROTONIX ) 40 MG tablet Take 1 tablet by mouth once daily (Patient not taking: Reported on 11/18/2023) 90 tablet 0   No facility-administered medications prior to visit.    ROS Review of Systems  Constitutional:  Negative for appetite change, chills, diaphoresis, fatigue and fever.  HENT:  Positive for trouble swallowing. Negative for sore throat.   Respiratory:  Positive for choking. Negative for shortness of breath and wheezing.   Cardiovascular:  Negative for chest pain, palpitations  and leg swelling.  Gastrointestinal:  Positive for constipation. Negative for abdominal pain, blood in stool, diarrhea, nausea and vomiting.  Genitourinary:  Positive for frequency and urgency. Negative for difficulty urinating, dysuria and hematuria.  Musculoskeletal:  Positive for arthralgias, back pain and gait problem.  Skin: Negative.   Neurological:  Negative for dizziness and weakness.  Hematological:  Negative for adenopathy. Does not bruise/bleed easily.  Psychiatric/Behavioral: Negative.      Objective:  BP 138/83 (BP Location: Left Arm, Patient Position: Sitting, Cuff Size: Normal)   Pulse 77   Temp (!) 97.4 F (36.3 C) (Oral)   Ht 5' 2 (1.575 m)   Wt 173 lb (78.5 kg)   SpO2 98%   BMI 31.64 kg/m   BP Readings from Last 3 Encounters:  11/18/23 138/83  09/02/23 126/78  05/16/23 134/80    Wt Readings from Last 3 Encounters:  11/18/23 173 lb (78.5 kg)  09/02/23 174 lb (78.9 kg)  05/16/23 170 lb 3.2 oz (77.2 kg)    Physical Exam  Lab Results  Component Value Date   WBC 6.6 05/16/2023   HGB 14.5 05/16/2023   HCT 43.7 05/16/2023   PLT 402.0 (H) 05/16/2023   GLUCOSE 82 12/12/2022   CHOL 206 (H) 12/12/2022   TRIG 124.0 12/12/2022   HDL 44.20 12/12/2022   LDLCALC 137 (H) 12/12/2022   ALT 12 05/16/2023   AST 17 05/16/2023   NA 140 12/12/2022   K 4.3 12/12/2022   CL 104 12/12/2022   CREATININE 0.96 12/12/2022   BUN 21 12/12/2022   CO2 30 12/12/2022   TSH 3.90 05/16/2023    MM 3D SCREENING MAMMOGRAM BILATERAL BREAST Result Date: 11/07/2023 CLINICAL DATA:  Screening. EXAM: DIGITAL SCREENING BILATERAL MAMMOGRAM WITH TOMOSYNTHESIS AND CAD TECHNIQUE: Bilateral screening digital craniocaudal and mediolateral oblique mammograms were obtained. Bilateral screening digital breast tomosynthesis was performed. The images were evaluated with computer-aided detection. COMPARISON:  Previous exam(s). ACR Breast Density Category b: There are scattered areas of fibroglandular  density. FINDINGS: In the left breast, a possible asymmetry warrants further evaluation. In the right breast, no findings suspicious for malignancy. IMPRESSION: Further evaluation is suggested for possible asymmetry in the left breast. RECOMMENDATION: Diagnostic mammogram and possibly ultrasound of the left breast. (Code:FI-L-12M) The patient will be contacted regarding the findings, and additional imaging will be scheduled. BI-RADS CATEGORY  0: Incomplete: Need additional imaging evaluation. Electronically Signed   By: Dirk Arrant M.D.   On: 11/07/2023 15:59    Assessment & Plan:  Primary osteoarthritis involving multiple joints -     Ambulatory referral to Physical Therapy -     Basic metabolic panel with GFR; Future  Primary osteoarthritis of both knees -     Ambulatory referral to Physical Therapy -     Basic metabolic panel with GFR; Future  Chronic bilateral low back pain without sciatica -     Ambulatory referral to Physical Therapy -     Basic metabolic panel with  GFR; Future  Acquired hypothyroidism -     TSH; Future  Oropharyngeal dysphagia -     Ambulatory referral to Gastroenterology  OAB (overactive bladder) -     Ambulatory referral to Urology  Hyperlipidemia LDL goal <130 -     Lipid panel; Future -     Basic metabolic panel with GFR; Future  Gastroesophageal reflux disease with esophagitis without hemorrhage -     CBC with Differential/Platelet; Future -     Basic metabolic panel with GFR; Future -     Voquezna ; Take 1 tablet by mouth daily.     Follow-up: Return in about 6 months (around 05/20/2024).  Debby Molt, MD

## 2023-11-19 ENCOUNTER — Ambulatory Visit: Payer: Self-pay | Admitting: Internal Medicine

## 2023-11-19 LAB — TSH: TSH: 2.66 u[IU]/mL (ref 0.35–5.50)

## 2023-11-19 MED ORDER — LEVOTHYROXINE SODIUM 75 MCG PO TABS: 75.0000 ug | ORAL_TABLET | Freq: Every day | ORAL | 1 refills | Status: AC

## 2023-11-25 ENCOUNTER — Other Ambulatory Visit (HOSPITAL_COMMUNITY): Payer: Self-pay

## 2023-11-26 ENCOUNTER — Encounter (HOSPITAL_COMMUNITY): Payer: Self-pay

## 2023-11-26 ENCOUNTER — Ambulatory Visit (HOSPITAL_COMMUNITY)
Admission: RE | Admit: 2023-11-26 | Discharge: 2023-11-26 | Disposition: A | Discharge status: Home / Self Care | Source: Ambulatory Visit | Attending: Internal Medicine | Admitting: Internal Medicine

## 2023-11-26 ENCOUNTER — Other Ambulatory Visit (HOSPITAL_COMMUNITY)

## 2023-11-26 DIAGNOSIS — R92322 Mammographic fibroglandular density, left breast: Secondary | ICD-10-CM | POA: Diagnosis not present

## 2023-11-26 DIAGNOSIS — N6002 Solitary cyst of left breast: Secondary | ICD-10-CM | POA: Diagnosis not present

## 2023-11-26 DIAGNOSIS — N6323 Unspecified lump in the left breast, lower outer quadrant: Secondary | ICD-10-CM | POA: Diagnosis not present

## 2023-11-26 DIAGNOSIS — R928 Other abnormal and inconclusive findings on diagnostic imaging of breast: Secondary | ICD-10-CM | POA: Diagnosis not present

## 2023-12-12 ENCOUNTER — Telehealth: Payer: Self-pay

## 2023-12-12 NOTE — Telephone Encounter (Signed)
 Copied from CRM [id number]. Topic: Clinical - Medication Question >> Dec 12, 2023  2:25 PM Shereese L wrote: Reason for CRM: Patient stated that she was using the samples for Vonoprazan Fumarate  (VOQUEZNA ) 20 MG TABS and stated its working and would like for a full script to be sent in.

## 2023-12-16 ENCOUNTER — Encounter: Payer: Self-pay | Admitting: Internal Medicine

## 2023-12-16 ENCOUNTER — Other Ambulatory Visit: Payer: Self-pay | Admitting: Internal Medicine

## 2023-12-16 DIAGNOSIS — K2100 Gastro-esophageal reflux disease with esophagitis, without bleeding: Secondary | ICD-10-CM

## 2023-12-16 MED ORDER — VOQUEZNA 10 MG PO TABS: 1.0000 | ORAL_TABLET | Freq: Every day | ORAL | 1 refills | Status: DC

## 2023-12-16 NOTE — Telephone Encounter (Signed)
**Note De-identified  [place] Obfuscation** Please advise 

## 2023-12-17 NOTE — Telephone Encounter (Signed)
 Copied from CRM [id number]. Topic: Clinical - Medication Question >> Dec 12, 2023  2:25 PM Theresa Nichols wrote: Reason for CRM: Patient stated that she was using the samples for Vonoprazan Fumarate  (VOQUEZNA ) 20 MG TABS and stated its working and would like for a full script to be sent in. >> Dec 17, 2023 11:59 AM Theresa Nichols wrote: Melia from [facility] called stating the patient need prior authorization for VOQUEZNA . Omeprazole and pantoprazole  does not require prior authorization [phone]

## 2023-12-19 ENCOUNTER — Other Ambulatory Visit: Payer: Self-pay | Admitting: Internal Medicine

## 2023-12-19 DIAGNOSIS — K2100 Gastro-esophageal reflux disease with esophagitis, without bleeding: Secondary | ICD-10-CM

## 2023-12-19 MED ORDER — ESOMEPRAZOLE MAGNESIUM 40 MG PO CPDR: 40.0000 mg | DELAYED_RELEASE_CAPSULE | Freq: Every day | ORAL | 0 refills | Status: DC

## 2023-12-19 NOTE — Telephone Encounter (Signed)
 Please Francee Piccolo

## 2023-12-19 NOTE — Telephone Encounter (Signed)
Changed to nexium.

## 2024-01-03 ENCOUNTER — Ambulatory Visit
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
## 2024-01-27 ENCOUNTER — Encounter: Payer: Self-pay | Admitting: Internal Medicine

## 2024-01-27 ENCOUNTER — Ambulatory Visit: Payer: Self-pay | Attending: Internal Medicine | Admitting: Internal Medicine

## 2024-01-27 VITALS — BP 175/98 | HR 71 | Temp 97.4°F | Resp 16 | Ht 62.0 in | Wt 178.8 lb

## 2024-01-27 DIAGNOSIS — M1711 Unilateral primary osteoarthritis, right knee: Secondary | ICD-10-CM

## 2024-01-27 DIAGNOSIS — G8929 Other chronic pain: Secondary | ICD-10-CM

## 2024-01-27 DIAGNOSIS — M170 Bilateral primary osteoarthritis of knee: Secondary | ICD-10-CM | POA: Diagnosis not present

## 2024-01-27 DIAGNOSIS — M545 Low back pain, unspecified: Secondary | ICD-10-CM | POA: Diagnosis not present

## 2024-01-27 DIAGNOSIS — M150 Primary generalized (osteo)arthritis: Secondary | ICD-10-CM

## 2024-01-27 DIAGNOSIS — I7300 Raynaud's syndrome without gangrene: Secondary | ICD-10-CM

## 2024-01-27 MED ORDER — TRIAMCINOLONE ACETONIDE 40 MG/ML IJ SUSP
40.0000 mg | INTRAMUSCULAR | Status: AC | PRN
  Administered 2024-01-27: 40 mg via INTRA_ARTICULAR

## 2024-01-27 MED ORDER — LIDOCAINE HCL 1 % IJ SOLN
2.0000 mL | INTRAMUSCULAR | Status: AC | PRN
  Administered 2024-01-27: 2 mL

## 2024-01-27 MED ORDER — DICLOFENAC SODIUM 25 MG PO TBEC: 25.0000 mg | DELAYED_RELEASE_TABLET | Freq: Three times a day (TID) | ORAL | 2 refills | Status: DC | PRN

## 2024-02-28 ENCOUNTER — Other Ambulatory Visit: Payer: Self-pay

## 2024-02-28 ENCOUNTER — Ambulatory Visit
Admission: RE | Admit: 2024-02-28 | Discharge: 2024-02-28 | Disposition: A | Discharge status: Home / Self Care | Source: Ambulatory Visit | Attending: Nurse Practitioner | Admitting: Nurse Practitioner

## 2024-02-28 VITALS — BP 155/85 | HR 80 | Temp 98.3°F | Resp 20

## 2024-02-28 DIAGNOSIS — R197 Diarrhea, unspecified: Secondary | ICD-10-CM | POA: Diagnosis not present

## 2024-02-28 DIAGNOSIS — A059 Bacterial foodborne intoxication, unspecified: Secondary | ICD-10-CM | POA: Diagnosis not present

## 2024-02-28 DIAGNOSIS — R109 Unspecified abdominal pain: Secondary | ICD-10-CM | POA: Diagnosis not present

## 2024-02-28 DIAGNOSIS — R112 Nausea with vomiting, unspecified: Secondary | ICD-10-CM | POA: Diagnosis not present

## 2024-02-28 LAB — POCT URINE DIPSTICK
Bilirubin, UA: NEGATIVE
Blood, UA: NEGATIVE
Glucose, UA: NEGATIVE mg/dL
Ketones, POC UA: NEGATIVE mg/dL
Nitrite, UA: NEGATIVE
POC PROTEIN,UA: NEGATIVE
Spec Grav, UA: 1.025 (ref 1.010–1.025)
Urobilinogen, UA: 0.2 U/dL
pH, UA: 6 (ref 5.0–8.0)

## 2024-02-28 MED ORDER — FAMOTIDINE 40 MG PO TABS
40.0000 mg | ORAL_TABLET | Freq: Once | ORAL | Status: AC
  Administered 2024-02-28: 40 mg via ORAL

## 2024-02-28 MED ORDER — ONDANSETRON 4 MG PO TBDP
4.0000 mg | ORAL_TABLET | Freq: Once | ORAL | Status: AC
  Administered 2024-02-28: 4 mg via ORAL

## 2024-02-28 MED ORDER — MYLANTA MAXIMUM STRENGTH 400-400-40 MG/5ML PO SUSP: 15.0000 mL | Freq: Four times a day (QID) | ORAL | 0 refills | Status: DC | PRN

## 2024-02-28 MED ORDER — LIDOCAINE VISCOUS HCL 2 % MT SOLN
15.0000 mL | Freq: Once | OROMUCOSAL | Status: AC
  Administered 2024-02-28: 15 mL via OROMUCOSAL

## 2024-02-28 MED ORDER — ALUM & MAG HYDROXIDE-SIMETH 200-200-20 MG/5ML PO SUSP
30.0000 mL | Freq: Once | ORAL | Status: AC
  Administered 2024-02-28: 30 mL via ORAL

## 2024-02-28 MED ORDER — KETOROLAC TROMETHAMINE 30 MG/ML IJ SOLN
30.0000 mg | Freq: Once | INTRAMUSCULAR | Status: AC
  Administered 2024-02-28: 30 mg via INTRAMUSCULAR

## 2024-02-28 MED ORDER — ONDANSETRON 4 MG PO TBDP: 4.0000 mg | ORAL_TABLET | Freq: Three times a day (TID) | ORAL | 0 refills | Status: DC | PRN

## 2024-02-28 NOTE — Discharge Instructions (Addendum)
 You were given an injection of Toradol 30 mg.  You may continue to take Tylenol this evening as needed for pain or discomfort. Recommend a BRAT diet to include bananas, rice, applesauce, and toast until nausea and vomiting improved. As discussed, if your abdominal pain worsens, or you develop new symptoms of fever, chills, worsening nausea, vomiting, or other concerns, would like for you to follow-up in the emergency department immediately for further evaluation. Follow-up as needed.

## 2024-02-28 NOTE — ED Provider Notes (Signed)
 [facility]    CSN: [id number] Arrival date & time: 02/28/24  1746      History   Chief Complaint Chief Complaint  Patient presents with   Abdominal Pain    Entered by patient    HPI Theresa Nichols is a 68 y.o. female.   The history is provided by the patient.   Patient presents for complaints of abdominal pain with nausea, vomiting, and diarrhea.  Patient states symptoms started last evening after she ate at a [employer].  She states shortly after she ate, her symptoms started.  She endorses continued nausea and abdominal cramping.  Patient describes the cramping as like a contraction.  States that the pain comes in waves.  She reports 2-3 episodes of vomiting today, no episodes of diarrhea.  States that she did take Kaopectate earlier.  Denies fever, gas, bloating, bloody's, or urinary symptoms.  States that she has not had much to eat or drink today, but states that she has been able to keep water down.  Past Medical History:  Diagnosis Date   Allergy    seasonal   Anxiety    Arthritis    knees,left hip   Blood transfusion without reported diagnosis    1983   Common migraine with intractable migraine 09/27/2020   Depression    GERD (gastroesophageal reflux disease)    Heart murmur    History of concussion 09/27/2020   06/2017   Thyroid  disease    hypothyroidism   Vitamin B12 deficiency     Patient Active Problem List   Diagnosis Date Noted   Oropharyngeal dysphagia 11/18/2023   OAB (overactive bladder) 11/18/2023   Lung nodule, multiple 07/08/2023   Screening mammogram for breast cancer 05/16/2023   Chondromalacia of left patella 02/06/2023   Lung nodule, solitary 02/01/2023   Cervical cancer screening 12/14/2022   Gastroesophageal reflux disease with esophagitis without hemorrhage 08/30/2022   Bilateral dry eyes 08/13/2022   Nuclear senile cataract of both eyes 08/13/2022   Restless legs syndrome (RLS) 07/23/2022   Primary  hypertension 05/07/2022   Primary osteoarthritis involving multiple joints 01/31/2022   Irritable bowel syndrome with diarrhea 01/31/2022   Seasonal allergic rhinitis due to pollen 09/27/2021   Chronic bilateral low back pain without sciatica 09/27/2021   Primary osteoarthritis of both knees 09/27/2021   Recurrent oral herpes simplex 09/27/2021   Low vitamin B12 level 09/27/2021   Hyperlipidemia LDL goal <130 09/27/2021   Encounter for screening for HIV 09/27/2021   Vitamin D  deficiency 09/26/2021   ANA positive 03/20/2021   Hypothyroidism 03/15/2021   Encounter for general adult medical examination with abnormal findings 03/15/2021   Raynaud's phenomenon without gangrene 03/15/2021   Common migraine with intractable migraine 09/27/2020    Past Surgical History:  Procedure Laterality Date   ABDOMINAL HYSTERECTOMY     DILATION AND CURETTAGE OF UTERUS     x3   KNEE SURGERY Right     OB History   No obstetric history on file.      Home Medications    Prior to Admission medications   Medication Sig Start Date End Date Taking? Authorizing Provider  alum & mag hydroxide-simeth (MYLANTA MAXIMUM STRENGTH) 400-400-40 MG/5ML suspension Take 15 mLs by mouth every 6 (six) hours as needed for indigestion. 02/28/24  Yes Leath-Warren, Etta PARAS, NP  ondansetron (ZOFRAN-ODT) 4 MG disintegrating tablet Take 1 tablet (4 mg total) by mouth every 8 (eight) hours as needed. 02/28/24  Yes Leath-Warren, Etta PARAS, NP  CALCIUM  PO Take by mouth.    [provider]  cholecalciferol (VITAMIN D ) 1000 UNITS tablet Take 1,000 Units by mouth daily.    [provider]  Coenzyme Q10 (CO Q 10 PO) Take by mouth.    [provider]  Cyanocobalamin  (VITAMIN B 12 PO) Take by mouth.    [provider]  cyclobenzaprine  (FLEXERIL ) 5 MG tablet Take 1 tablet (5 mg total) by mouth at bedtime as needed. 03/08/23   Jeannetta Lonni ORN, MD  diclofenac  (VOLTAREN ) 25 MG EC tablet Take 1  tablet (25 mg total) by mouth 3 (three) times daily as needed. 01/27/24   Jeannetta Lonni ORN, MD  diclofenac  Sodium (VOLTAREN ) 1 % GEL Apply 2 g topically 4 (four) times daily. 07/23/22   Joshua Debby CROME, MD  EQ ALL DAY ALLERGY RELIEF 10 MG tablet TAKE 1 TABLET BY MOUTH ONCE DAILY AS NEEDED FOR ALLERGIES Patient taking differently: as needed. 09/30/23   Joshua Debby CROME, MD  esomeprazole  (NEXIUM ) 40 MG capsule Take 1 capsule (40 mg total) by mouth daily. 12/19/23   Joshua Debby CROME, MD  fluocinonide  cream (LIDEX ) 0.05 % Apply 1 Application topically as needed. 11/12/23   Joshua Debby CROME, MD  fluticasone  (FLONASE ) 50 MCG/ACT nasal spray Place 2 sprays into both nostrils daily. Patient taking differently: Place 2 sprays into both nostrils as needed. 07/23/22   Joshua Debby CROME, MD  levothyroxine  (SYNTHROID ) 75 MCG tablet Take 1 tablet (75 mcg total) by mouth daily before breakfast. 11/19/23   Joshua Debby CROME, MD  MELATONIN PO Take by mouth.    [provider]  Multiple Vitamins-Minerals (MULTIVITAMIN & MINERAL PO) Take by mouth daily.    [provider]  triamcinolone  cream (KENALOG ) 0.1 % SMARTSIG:1 Application Topical 2-3 Times Daily 11/07/23   [provider]  Turmeric (QC TUMERIC COMPLEX) 500 MG CAPS Take by mouth.    [provider]  UNABLE TO FIND Med Name: weight loss clinic - non RX for weight loss - compounded Trizepitide or semuglutide weekly Patient not taking: Reported on 01/27/2024    [provider]  valACYclovir  (VALTREX ) 500 MG tablet Take 1 tablet (500 mg total) by mouth daily. Patient taking differently: Take 500 mg by mouth as needed. 05/16/23   Joshua Debby CROME, MD    Family History Family History  Problem Relation Age of Onset   Diabetes Mother    Heart disease Mother    Diverticulosis Mother    Pancreatitis Mother    Asthma Mother    Heart disease Father    Arthritis Father    Seizures Brother    Kidney disease Brother    Breast cancer  Cousin    Colon cancer Neg Hx    Colon polyps Neg Hx    Esophageal cancer Neg Hx    Rectal cancer Neg Hx    Stomach cancer Neg Hx     Social History Social History   Tobacco Use   Smoking status: Former    Current packs/day: 0.00    Average packs/day: 0.5 packs/day for 15.0 years (7.5 ttl pk-yrs)    Types: Cigarettes    Start date: 39    Quit date: 1989    Years since quitting: 36.8    Passive exposure: Past   Smokeless tobacco: Never  Vaping Use   Vaping status: Never Used  Substance Use Topics   Alcohol use: Yes    Comment: 1-2  times per month   Drug use: Never  Allergies   Maxalt  [rizatriptan ] and Topamax  [topiramate ]   Review of Systems Review of Systems Per HPI  Physical Exam Triage Vital Signs ED Triage Vitals  Encounter Vitals Group     BP 02/28/24 1814 (!) 155/85     Girls Systolic BP Percentile --      Girls Diastolic BP Percentile --      Boys Systolic BP Percentile --      Boys Diastolic BP Percentile --      Pulse Rate 02/28/24 1814 80     Resp 02/28/24 1814 20     Temp 02/28/24 1814 98.3 F (36.8 C)     Temp Source 02/28/24 1814 Oral     SpO2 02/28/24 1814 98 %     Weight --      Height --      Head Circumference --      Peak Flow --      Pain Score 02/28/24 1813 4     Pain Loc --      Pain Education --      Exclude from Growth Chart --    No data found.  Updated Vital Signs BP (!) 155/85 (BP Location: Right Arm)   Pulse 80   Temp 98.3 F (36.8 C) (Oral)   Resp 20   SpO2 98%   Visual Acuity Right Eye Distance:   Left Eye Distance:   Bilateral Distance:    Right Eye Near:   Left Eye Near:    Bilateral Near:     Physical Exam Vitals and nursing note reviewed.  Constitutional:      General: She is not in acute distress.    Appearance: She is well-developed.  HENT:     Head: Normocephalic.  Eyes:     Extraocular Movements: Extraocular movements intact.     Conjunctiva/sclera: Conjunctivae normal.     Pupils:  Pupils are equal, round, and reactive to light.  Cardiovascular:     Rate and Rhythm: Normal rate and regular rhythm.     Pulses: Normal pulses.     Heart sounds: Normal heart sounds.  Pulmonary:     Effort: Pulmonary effort is normal. No respiratory distress.     Breath sounds: Normal breath sounds. No stridor. No wheezing, rhonchi or rales.  Abdominal:     General: Bowel sounds are normal.     Palpations: Abdomen is soft.     Tenderness: There is abdominal tenderness.  Musculoskeletal:     Cervical back: Normal range of motion.  Skin:    General: Skin is warm and dry.  Neurological:     General: No focal deficit present.     Mental Status: She is alert and oriented to person, place, and time.  Psychiatric:        Mood and Affect: Mood normal.        Behavior: Behavior normal.      UC Treatments / Results  Labs (all labs ordered are listed, but only abnormal results are displayed) Labs Reviewed  POCT URINE DIPSTICK - Abnormal; Notable for the following components:      Result Value   Clarity, UA cloudy (*)    Leukocytes, UA Trace (*)    All other components within normal limits  URINE CULTURE    EKG   Radiology No results found.  Procedures Procedures (including critical care time)  Medications Ordered in UC Medications  ondansetron (ZOFRAN-ODT) disintegrating tablet 4 mg (4 mg Oral Given 02/28/24 1850)  ketorolac (TORADOL) 30  MG/ML injection 30 mg (30 mg Intramuscular Given 02/28/24 1850)  famotidine  (PEPCID ) tablet 40 mg (40 mg Oral Given 02/28/24 1850)  alum & mag hydroxide-simeth (MAALOX/MYLANTA) 200-200-20 MG/5ML suspension 30 mL (30 mLs Oral Given 02/28/24 1907)  lidocaine (XYLOCAINE) 2 % viscous mouth solution 15 mL (15 mLs Mouth/Throat Given 02/28/24 1907)    Initial Impression / Assessment and Plan / UC Course  I have reviewed the triage vital signs and the nursing notes.  Pertinent labs & imaging results that were available during my care of the  patient were reviewed by me and considered in my medical decision making (see chart for details).  Patient presents with a 1 day history of nausea, vomiting, diarrhea, and abdominal pain.  On exam, the patient is well-appearing, she is in no acute distress, vital signs are stable although she is mildly hypertensive.  Patient was given Zofran 4 mg ODT, Toradol 30 mg IM, famotidine  40 mg, and a GI cocktail.  After the medications, patient states I am feeling better.  Suspect patient may be experiencing food poisoning.  Symptomatic treatment provided with Mylanta maximum strength and ondansetron 4 mg ODT.  The urinalysis was negative for obvious signs of infection; however, a urine culture is pending.  Supportive care recommendations were provided and discussed with the patient to include fluids, rest, over-the-counter analgesics, a BRAT diet, and to monitor for worsening symptoms.  Patient was given strict ER follow-up precautions.  Patient was in agreement with this plan of care and verbalizes understanding.  All questions were answered.  Patient stable for discharge.  Final Clinical Impressions(s) / UC Diagnoses   Final diagnoses:  Abdominal pain, unspecified abdominal location  Nausea, vomiting and diarrhea     Discharge Instructions      You were given an injection of Toradol 30 mg.  You may continue to take Tylenol this evening as needed for pain or discomfort. Recommend a BRAT diet to include bananas, rice, applesauce, and toast until nausea and vomiting improved. As discussed, if your abdominal pain worsens, or you develop new symptoms of fever, chills, worsening nausea, vomiting, or other concerns, would like for you to follow-up in the emergency department immediately for further evaluation. Follow-up as needed.     ED Prescriptions     Medication Sig Dispense Auth. Provider   ondansetron (ZOFRAN-ODT) 4 MG disintegrating tablet Take 1 tablet (4 mg total) by mouth every 8 (eight)  hours as needed. 20 tablet Leath-Warren, Etta PARAS, NP   alum & mag hydroxide-simeth (MYLANTA MAXIMUM STRENGTH) 400-400-40 MG/5ML suspension Take 15 mLs by mouth every 6 (six) hours as needed for indigestion. 355 mL Leath-Warren, Etta PARAS, NP      PDMP not reviewed this encounter.   Gilmer Etta PARAS, NP 02/28/24 1956

## 2024-02-28 NOTE — ED Triage Notes (Signed)
 Pt reports abdominal pain since going out to eat last night. Pt reports emesis, diarrhea, intermittent abdominal cramping ever since. Has taken otc medication for diarrhea.

## 2024-02-29 LAB — URINE CULTURE: Culture: <10000 — AB

## 2024-03-02 ENCOUNTER — Ambulatory Visit (HOSPITAL_COMMUNITY): Payer: Self-pay

## 2024-03-16 DIAGNOSIS — H524 Presbyopia: Secondary | ICD-10-CM | POA: Diagnosis not present

## 2024-03-16 DIAGNOSIS — H52209 Unspecified astigmatism, unspecified eye: Secondary | ICD-10-CM | POA: Diagnosis not present

## 2024-03-16 DIAGNOSIS — H5203 Hypermetropia, bilateral: Secondary | ICD-10-CM | POA: Diagnosis not present

## 2024-03-17 ENCOUNTER — Other Ambulatory Visit: Payer: Self-pay | Admitting: Internal Medicine

## 2024-03-17 DIAGNOSIS — K2100 Gastro-esophageal reflux disease with esophagitis, without bleeding: Secondary | ICD-10-CM

## 2024-03-25 ENCOUNTER — Telehealth: Payer: Self-pay | Admitting: Gastroenterology

## 2024-03-25 NOTE — Telephone Encounter (Signed)
 Can you offer Friday 12/5 with Delon 210 pm?

## 2024-03-25 NOTE — Telephone Encounter (Signed)
 yes

## 2024-03-25 NOTE — Telephone Encounter (Signed)
 Did you add it?

## 2024-03-25 NOTE — Telephone Encounter (Signed)
 Inbound call from patient stating that she is scheduled for 1/5 with Camie. She states that her symptoms have worsened and is requesting a call back to discuss. Please advise.

## 2024-03-25 NOTE — Telephone Encounter (Signed)
 Patient has accepted appointment for 12/5 at 2:10

## 2024-03-26 ENCOUNTER — Ambulatory Visit

## 2024-03-26 NOTE — Progress Notes (Unsigned)
 Chief Complaint: Dysphagia  HPI:    Theresa Nichols is a 68 year old female previously known to Dr. Eda, with a past medical history as listed below including anxiety and GERD, who was referred to me by Joshua Debby CROME, MD for a complaint of dysphagia.      07/06/22 patient seen by Dr. Eda in clinic for new onset of dysphonia not responding to Tums or Nexium , suspected LPR and EGD recommended.  Also discussed positive Cologuard 04/05/2021 with colonoscopy recommended.  Noted postprandial bloating and gas.  Patient started on Pantoprazole  40 mg every morning and recommended EGD and colon.  Does not like these were ever done.    02/28/2024 patient seen in urgent car for abdominal pain with nausea vomiting and diarrhea.  Noted the symptoms started after she ate a [employer].  Nausea and abdominal cramping.  She was given Zofran , Toradol , Famotidine  and GI cocktail and was feeling better.  Patient given Zofran  and GI cocktail to take home.    Today, patient presents to clinic for feeling just not right in her GI symptoms.  Tells me she had recent food poisoning as above and was seen in the urgent care.  Since then she has just not gotten completely better.  Tells me that she has discomfort on the left side of her abdomen sometimes and sometimes she will have a bowel movement and other times she does not.  It does not necessarily help if she does pass the stool.  Also feels like she can feel things moving around inside.  A lot of bloating and reflux regardless of her Nexium  40 mg daily.  Stools are sometimes loose.  Describes chronic issues with dairy and veggies, but feels like almost anything now can give her bloating and gas.    Denies fever, chills or weight loss.  Past Medical History:  Diagnosis Date   Allergy    seasonal   Anxiety    Arthritis    knees,left hip   Blood transfusion without reported diagnosis    1983   Common migraine with intractable migraine 09/27/2020    Depression    GERD (gastroesophageal reflux disease)    Heart murmur    History of concussion 09/27/2020   06/2017   Thyroid  disease    hypothyroidism   Vitamin B12 deficiency     Past Surgical History:  Procedure Laterality Date   ABDOMINAL HYSTERECTOMY     DILATION AND CURETTAGE OF UTERUS     x3   KNEE SURGERY Right     Current Outpatient Medications  Medication Sig Dispense Refill   alum & mag hydroxide-simeth (MYLANTA MAXIMUM STRENGTH) 400-400-40 MG/5ML suspension Take 15 mLs by mouth every 6 (six) hours as needed for indigestion. 355 mL 0   CALCIUM  PO Take by mouth.     cholecalciferol (VITAMIN D ) 1000 UNITS tablet Take 1,000 Units by mouth daily.     Coenzyme Q10 (CO Q 10 PO) Take by mouth.     Cyanocobalamin  (VITAMIN B 12 PO) Take by mouth.     cyclobenzaprine  (FLEXERIL ) 5 MG tablet Take 1 tablet (5 mg total) by mouth at bedtime as needed. 30 tablet 0   diclofenac  (VOLTAREN ) 25 MG EC tablet Take 1 tablet (25 mg total) by mouth 3 (three) times daily as needed. 90 tablet 2   diclofenac  Sodium (VOLTAREN ) 1 % GEL Apply 2 g topically 4 (four) times daily. 300 g 1   EQ ALL DAY ALLERGY RELIEF 10 MG tablet TAKE 1  TABLET BY MOUTH ONCE DAILY AS NEEDED FOR ALLERGIES (Patient taking differently: as needed.) 90 tablet 0   esomeprazole  (NEXIUM ) 40 MG capsule Take 1 capsule by mouth once daily 90 capsule 0   fluocinonide  cream (LIDEX ) 0.05 % Apply 1 Application topically as needed. 30 g 0   fluticasone  (FLONASE ) 50 MCG/ACT nasal spray Place 2 sprays into both nostrils daily. (Patient taking differently: Place 2 sprays into both nostrils as needed.) 48 g 1   levothyroxine  (SYNTHROID ) 75 MCG tablet Take 1 tablet (75 mcg total) by mouth daily before breakfast. 90 tablet 1   MELATONIN PO Take by mouth.     Multiple Vitamins-Minerals (MULTIVITAMIN & MINERAL PO) Take by mouth daily.     ondansetron  (ZOFRAN -ODT) 4 MG disintegrating tablet Take 1 tablet (4 mg total) by mouth every 8 (eight) hours  as needed. 20 tablet 0   triamcinolone  cream (KENALOG ) 0.1 % SMARTSIG:1 Application Topical 2-3 Times Daily     Turmeric (QC TUMERIC COMPLEX) 500 MG CAPS Take by mouth.     UNABLE TO FIND Med Name: weight loss clinic - non RX for weight loss - compounded Trizepitide or semuglutide weekly (Patient not taking: Reported on 01/27/2024)     valACYclovir  (VALTREX ) 500 MG tablet Take 1 tablet (500 mg total) by mouth daily. (Patient taking differently: Take 500 mg by mouth as needed.) 90 tablet 1   No current facility-administered medications for this visit.    Allergies as of 03/27/2024 - Review Complete 02/28/2024  Allergen Reaction Noted   Maxalt  [rizatriptan ] Other (See Comments) 06/19/2021   Topamax  [topiramate ] Other (See Comments) 06/19/2021    Family History  Problem Relation Age of Onset   Diabetes Mother    Heart disease Mother    Diverticulosis Mother    Pancreatitis Mother    Asthma Mother    Heart disease Father    Arthritis Father    Seizures Brother    Kidney disease Brother    Breast cancer Cousin    Colon cancer Neg Hx    Colon polyps Neg Hx    Esophageal cancer Neg Hx    Rectal cancer Neg Hx    Stomach cancer Neg Hx     Social History   Socioeconomic History   Marital status: Divorced    Spouse name: Not on file   Number of children: Not on file   Years of education: Not on file   Highest education level: GED or equivalent  Occupational History   Occupation: retired  Tobacco Use   Smoking status: Former    Current packs/day: 0.00    Average packs/day: 0.5 packs/day for 15.0 years (7.5 ttl pk-yrs)    Types: Cigarettes    Start date: 11    Quit date: 1989    Years since quitting: 36.9    Passive exposure: Past   Smokeless tobacco: Never  Vaping Use   Vaping status: Never Used  Substance and Sexual Activity   Alcohol use: Yes    Comment: 1-2  times per month   Drug use: Never   Sexual activity: Yes    Partners: Male  Other Topics Concern   Not on  file  Social History Narrative   Lives with grandaughter   Left handed   Drinks 1-2 cups caffeine daily   Social Drivers of Health   Financial Resource Strain: High Risk (09/02/2023)   Overall Financial Resource Strain (CARDIA)    Difficulty of Paying Living Expenses: Hard  Food Insecurity: Food Insecurity  Present (09/02/2023)   Hunger Vital Sign    Worried About Running Out of Food in the Last Year: Sometimes true    Ran Out of Food in the Last Year: Never true  Transportation Needs: No Transportation Needs (09/02/2023)   PRAPARE - Administrator, Civil Service (Medical): No    Lack of Transportation (Non-Medical): No  Physical Activity: Insufficiently Active (09/02/2023)   Exercise Vital Sign    Days of Exercise per Week: 2 days    Minutes of Exercise per Session: 20 min  Stress: No Stress Concern Present (09/02/2023)   [employer] of Occupational Health - Occupational Stress Questionnaire    Feeling of Stress : Only a little  Social Connections: Moderately Isolated (09/02/2023)   Social Connection and Isolation Panel    Frequency of Communication with Friends and Family: More than three times a week    Frequency of Social Gatherings with Friends and Family: Once a week    Attends Religious Services: 1 to 4 times per year    Active Member of [employer] or Organizations: No    Attends Banker Meetings: Never    Marital Status: Divorced  Catering Manager Violence: Not At Risk (12/31/2022)   Humiliation, Afraid, Rape, and Kick questionnaire    Fear of Current or Ex-Partner: No    Emotionally Abused: No    Physically Abused: No    Sexually Abused: No    Review of Systems:    Constitutional: No weight loss, fever or chills Skin: No rash  Cardiovascular: No chest pain  Respiratory: No SOB Gastrointestinal: See HPI and otherwise negative Genitourinary: No dysuria  Neurological: No headache, dizziness or syncope Musculoskeletal: No new muscle or joint  pain Hematologic: No bruising Psychiatric: No history of depression or anxiety   Physical Exam:  Vital signs: BP (!) 160/82   Pulse 66   Ht 5' 3 (1.6 m)   Wt 182 lb (82.6 kg)   BMI 32.24 kg/m    Constitutional:   Pleasant Caucasian female appears to be in NAD, Well developed, Well nourished, alert and cooperative Respiratory: Respirations even and unlabored. Lungs clear to auscultation bilaterally.   No wheezes, crackles, or rhonchi.  Cardiovascular: Normal S1, S2. No MRG. Regular rate and rhythm. No peripheral edema, cyanosis or pallor.  Gastrointestinal:  Soft, nondistended, moderate left lower quadrant TTP with some involuntary guarding, generalized abdominal tenderness, normal bowel sounds. No appreciable masses or hepatomegaly. Rectal:  Not performed.  Psychiatric: Demonstrates good judgement and reason without abnormal affect or behaviors.  RELEVANT LABS AND IMAGING: CBC    Component Value Date/Time   WBC 6.0 11/18/2023 1206   RBC 4.69 11/18/2023 1206   HGB 14.0 11/18/2023 1206   HCT 42.8 11/18/2023 1206   PLT 364.0 11/18/2023 1206   MCV 91.2 11/18/2023 1206   MCHC 32.6 11/18/2023 1206   RDW 14.4 11/18/2023 1206   LYMPHSABS 1.6 11/18/2023 1206   MONOABS 0.4 11/18/2023 1206   EOSABS 0.1 11/18/2023 1206   BASOSABS 0.0 11/18/2023 1206    CMP     Component Value Date/Time   NA 140 11/18/2023 1206   K 4.4 11/18/2023 1206   CL 104 11/18/2023 1206   CO2 28 11/18/2023 1206   GLUCOSE 92 11/18/2023 1206   BUN 21 11/18/2023 1206   CREATININE 0.84 11/18/2023 1206   CALCIUM  9.7 11/18/2023 1206   PROT 6.9 05/16/2023 0924   ALBUMIN 4.3 05/16/2023 0924   AST 17 05/16/2023 0924   ALT  12 05/16/2023 0924   ALKPHOS 72 05/16/2023 0924   BILITOT 0.6 05/16/2023 0924    Assessment: 1.  Abdominal pain: On exam today fairly extreme left lower quadrant discomfort with some involuntary guarding and rebound, concern for diverticulitis 2.  Positive Cologuard: Back in 2022 patient  directed to have a colonoscopy at the time but never followed through 3.  GERD and bloating: Patient describes symptoms regardless of Nexium  40 mg daily, recent food poisoning episode which could have exacerbated gastritis; reactive gastropathy/chronic gastritis +/- GERD  Plan: 1.  Patient scheduled for a CT of the abdomen and pelvis next Friday (per her request) for left lower quadrant pain on exam today. Did give ER precautions, including worsening pain or fever. 2.  Labs including CBC, CMP and lipase. 3.  Patient does need an EGD and colonoscopy.  Will schedule these pending results from above.  If has acute diverticulitis we will need to treat that first and wait. 4.  Continue Nexium  40 mg daily for now 5.  Patient to follow in clinic per recommendations after CT and labs. 6.  Assigned to Dr. Nandigam today.  Delon Failing, PA-C [facility] 03/26/2024, 3:38 PM  Cc: Joshua Debby CROME, MD

## 2024-03-27 ENCOUNTER — Encounter: Payer: Self-pay | Admitting: Physician Assistant

## 2024-03-27 ENCOUNTER — Other Ambulatory Visit

## 2024-03-27 ENCOUNTER — Ambulatory Visit: Admitting: Physician Assistant

## 2024-03-27 VITALS — BP 160/82 | HR 66 | Ht 63.0 in | Wt 182.0 lb

## 2024-03-27 DIAGNOSIS — R1032 Left lower quadrant pain: Secondary | ICD-10-CM

## 2024-03-27 DIAGNOSIS — R195 Other fecal abnormalities: Secondary | ICD-10-CM

## 2024-03-27 DIAGNOSIS — R140 Abdominal distension (gaseous): Secondary | ICD-10-CM | POA: Diagnosis not present

## 2024-03-27 DIAGNOSIS — R1084 Generalized abdominal pain: Secondary | ICD-10-CM

## 2024-03-27 DIAGNOSIS — K2100 Gastro-esophageal reflux disease with esophagitis, without bleeding: Secondary | ICD-10-CM | POA: Diagnosis not present

## 2024-03-27 DIAGNOSIS — K219 Gastro-esophageal reflux disease without esophagitis: Secondary | ICD-10-CM

## 2024-03-27 LAB — CBC WITH DIFFERENTIAL/PLATELET
Basophils Absolute: 0 K/uL (ref 0.0–0.1)
Basophils Relative: 0.6 % (ref 0.0–3.0)
Eosinophils Absolute: 0.1 K/uL (ref 0.0–0.7)
Eosinophils Relative: 2 % (ref 0.0–5.0)
HCT: 42.4 % (ref 36.0–46.0)
Hemoglobin: 14 g/dL (ref 12.0–15.0)
Lymphocytes Relative: 24.9 % (ref 12.0–46.0)
Lymphs Abs: 1.8 K/uL (ref 0.7–4.0)
MCHC: 33.1 g/dL (ref 30.0–36.0)
MCV: 90.3 fl (ref 78.0–100.0)
Monocytes Absolute: 0.6 K/uL (ref 0.1–1.0)
Monocytes Relative: 8.2 % (ref 3.0–12.0)
Neutro Abs: 4.6 K/uL (ref 1.4–7.7)
Neutrophils Relative %: 64.3 % (ref 43.0–77.0)
Platelets: 346 K/uL (ref 150.0–400.0)
RBC: 4.69 Mil/uL (ref 3.87–5.11)
RDW: 14.9 % (ref 11.5–15.5)
WBC: 7.1 K/uL (ref 4.0–10.5)

## 2024-03-27 LAB — COMPREHENSIVE METABOLIC PANEL WITH GFR
ALT: 12 U/L (ref 0–35)
AST: 16 U/L (ref 0–37)
Albumin: 4.1 g/dL (ref 3.5–5.2)
Alkaline Phosphatase: 66 U/L (ref 39–117)
BUN: 20 mg/dL (ref 6–23)
CO2: 29 meq/L (ref 19–32)
Calcium: 9.3 mg/dL (ref 8.4–10.5)
Chloride: 105 meq/L (ref 96–112)
Creatinine, Ser: 1.08 mg/dL (ref 0.40–1.20)
GFR: 53.3 mL/min — ABNORMAL LOW (ref >60.00)
Glucose, Bld: 80 mg/dL (ref 70–99)
Potassium: 4.6 meq/L (ref 3.5–5.1)
Sodium: 141 meq/L (ref 135–145)
Total Bilirubin: 0.6 mg/dL (ref 0.2–1.2)
Total Protein: 6.6 g/dL (ref 6.0–8.3)

## 2024-03-27 LAB — LIPASE: Lipase: 17 U/L (ref 11.0–59.0)

## 2024-03-27 NOTE — Patient Instructions (Addendum)
 Your provider has requested that you go to the basement level for lab work before leaving today. Press B on the elevator. The lab is located at the first door on the left as you exit the elevator.  Continue Nexium  40 mg daily   You have been scheduled for a CT scan of the abdomen and pelvis at [facility] , 1st floor Radiology. The address can be found later in this AVS under upcoming appointment. You are scheduled on 04/03/2024 at 5 pm. You should arrive at 2:45 pm for registration and to drink oral contrast.  Nothing to eat 4 hours prior  If you have any questions regarding your exam or if you need to reschedule, you may call [facility] Radiology Scheduling at [phone] between the hours of 8:00 am and 5:00 pm, Monday-Friday.

## 2024-03-30 ENCOUNTER — Ambulatory Visit: Payer: Self-pay | Admitting: Physician Assistant

## 2024-03-30 ENCOUNTER — Telehealth: Payer: Self-pay | Admitting: Physician Assistant

## 2024-03-30 NOTE — Telephone Encounter (Signed)
 Patient calling in regards to MyChart message. Please advise.

## 2024-03-30 NOTE — Telephone Encounter (Signed)
 The pt is also going to call and see if she can set up a payment plan.

## 2024-03-30 NOTE — Telephone Encounter (Signed)
 The pt has been advised that Delon has spoken to Dr Shila and to see what alternatives there are.  We will be in touch as soon as we get a response

## 2024-04-03 ENCOUNTER — Ambulatory Visit (HOSPITAL_COMMUNITY): Admission: RE | Admit: 2024-04-03 | Discharge: 2024-04-03 | Attending: Physician Assistant

## 2024-04-03 DIAGNOSIS — R140 Abdominal distension (gaseous): Secondary | ICD-10-CM | POA: Diagnosis not present

## 2024-04-03 DIAGNOSIS — K5730 Diverticulosis of large intestine without perforation or abscess without bleeding: Secondary | ICD-10-CM | POA: Diagnosis not present

## 2024-04-03 DIAGNOSIS — R1084 Generalized abdominal pain: Secondary | ICD-10-CM | POA: Diagnosis not present

## 2024-04-03 DIAGNOSIS — K2100 Gastro-esophageal reflux disease with esophagitis, without bleeding: Secondary | ICD-10-CM

## 2024-04-03 DIAGNOSIS — K219 Gastro-esophageal reflux disease without esophagitis: Secondary | ICD-10-CM | POA: Diagnosis not present

## 2024-04-03 MED ORDER — IOHEXOL 300 MG/ML  SOLN
100.0000 mL | Freq: Once | INTRAMUSCULAR | Status: AC | PRN
  Administered 2024-04-03: 100 mL via INTRAVENOUS

## 2024-04-03 MED ORDER — IOHEXOL 9 MG/ML PO SOLN
500.0000 mL | ORAL | Status: AC
  Administered 2024-04-03 (×2): 500 mL via ORAL

## 2024-04-03 MED ORDER — SODIUM CHLORIDE (PF) 0.9 % IJ SOLN
INTRAMUSCULAR | Status: AC
  Filled 2024-04-03: qty 50

## 2024-04-06 ENCOUNTER — Ambulatory Visit

## 2024-04-07 NOTE — Telephone Encounter (Signed)
 Harlene what strength Flagyl?

## 2024-04-07 NOTE — Telephone Encounter (Signed)
 Sorry I was thinking Delon and wrote Harlene.  Thank you I saw the other message.   No abx needed

## 2024-04-08 NOTE — Telephone Encounter (Signed)
 Patient is returning phone call. Please advise, thank you.

## 2024-04-27 ENCOUNTER — Ambulatory Visit: Admitting: Gastroenterology

## 2024-04-27 ENCOUNTER — Encounter: Payer: Self-pay | Admitting: Internal Medicine
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
## 2024-04-29 NOTE — Telephone Encounter (Signed)
 No- I have never heard of this

## 2024-05-01 ENCOUNTER — Ambulatory Visit: Admitting: Internal Medicine

## 2024-05-11 ENCOUNTER — Ambulatory Visit

## 2024-05-11 VITALS — Ht 62.0 in | Wt 182.0 lb

## 2024-05-11 DIAGNOSIS — Z1211 Encounter for screening for malignant neoplasm of colon: Secondary | ICD-10-CM

## 2024-05-11 DIAGNOSIS — R195 Other fecal abnormalities: Secondary | ICD-10-CM | POA: Diagnosis not present

## 2024-05-11 DIAGNOSIS — Z0000 Encounter for general adult medical examination without abnormal findings: Secondary | ICD-10-CM

## 2024-05-11 NOTE — Patient Instructions (Addendum)
 Theresa Nichols,  Thank you for taking the time for your Medicare Wellness Visit. I appreciate your continued commitment to your health goals. Please review the care plan we discussed, and feel free to reach out if I can assist you further.  Please note that Annual Wellness Visits do not include a physical exam. Some assessments may be limited, especially if the visit was conducted virtually. If needed, we may recommend an in-person follow-up with your provider.  Ongoing Care Seeing your primary care provider every 3 to 6 months helps us  monitor your health and provide consistent, personalized care.   Referrals If a referral was made during today's visit and you haven't received any updates within two weeks, please contact the referred provider directly to check on the status.  Recommended Screenings:  Health Maintenance  Topic Date Due   Zoster (Shingles) Vaccine (2 of 2) 11/20/2021   Flu Shot  11/22/2023   COVID-19 Vaccine (3 - 2025-26 season) 12/23/2023   Cologuard (Stool DNA test)  04/05/2024   Medicare Annual Wellness Visit  05/11/2025   Breast Cancer Screening  11/03/2025   DTaP/Tdap/Td vaccine (2 - Tdap) 04/23/2026   Pneumococcal Vaccine for age over 85  Completed   Osteoporosis screening with Bone Density Scan  Completed   Hepatitis C Screening  Completed   Meningitis B Vaccine  Aged Out       05/11/2024    3:29 PM  Advanced Directives  Does Patient Have a Medical Advance Directive? No  Would patient like information on creating a medical advance directive? No - Patient declined    Vision: Annual vision screenings are recommended for early detection of glaucoma, cataracts, and diabetic retinopathy. These exams can also reveal signs of chronic conditions such as diabetes and high blood pressure.  Dental: Annual dental screenings help detect early signs of oral cancer, gum disease, and other conditions linked to overall health, including heart disease and diabetes.

## 2024-05-11 NOTE — Progress Notes (Addendum)
 "  Chief Complaint  Patient presents with   Medicare Wellness     Subjective:   Theresa Nichols is a 69 y.o. female who presents for a Medicare Annual Wellness Visit.  Visit info / Clinical Intake: Medicare Wellness Visit Type:: Subsequent Annual Wellness Visit Persons participating in visit and providing information:: patient Medicare Wellness Visit Mode:: Telephone If telephone:: video declined Since this visit was completed virtually, some vitals may be partially provided or unavailable. Missing vitals are due to the limitations of the virtual format.: Unable to obtain vitals - no equipment If Telephone or Video please confirm:: I connected with patient using audio/video enable telemedicine. I verified patient identity with two identifiers, discussed telehealth limitations, and patient agreed to proceed. Patient Location:: Home Provider Location:: Office Interpreter Needed?: No AWV questionnaire completed by patient prior to visit?: yes Date:: 05/07/24 Living arrangements:: (!) lives alone Patient's Overall Health Status Rating: good Typical amount of pain: some Does pain affect daily life?: (!) yes Are you currently prescribed opioids?: no  Dietary Habits and Nutritional Risks How many meals a day?: (!) 1 Eats fruit and vegetables daily?: (!) no Most meals are obtained by: preparing own meals; eating out In the last 2 weeks, have you had any of the following?: none Diabetic:: no  Functional Status Activities of Daily Living (to include ambulation/medication): Independent Ambulation: Independent with device- listed below Home Assistive Devices/Equipment: Eyeglasses; Cane Medication Administration: Independent Home Management (perform basic housework or laundry): Independent Manage your own finances?: yes Primary transportation is: driving Concerns about vision?: no *vision screening is required for WTM* Concerns about hearing?: no  Fall Screening Falls in the past  year?: 1 Number of falls in past year: 0 (1) Was there an injury with Fall?: 0 Fall Risk Category Calculator: 1 Patient Fall Risk Level: Low Fall Risk  Fall Risk Patient at Risk for Falls Due to: Other (Comment) (tripped and fell) Fall risk Follow up: Falls evaluation completed; Falls prevention discussed  Home and Transportation Safety: All rugs have non-skid backing?: N/A, no rugs All stairs or steps have railings?: N/A, no stairs Grab bars in the bathtub or shower?: yes Have non-skid surface in bathtub or shower?: yes Good home lighting?: yes Regular seat belt use?: yes Hospital stays in the last year:: no  Cognitive Assessment Difficulty concentrating, remembering, or making decisions? : no Will 6CIT or Mini Cog be Completed: yes What year is it?: 0 points What month is it?: 0 points Give patient an address phrase to remember (5 components): [id number] [place], [place] ([employer]) Does Patient Have a Medical Advance Directive?: No Would patient like information on creating a medical advance directive?: No - Patient declined  Reviewed/Updated  Reviewed/Updated: Reviewed All (Medical, Surgical, Family, Medications, Allergies, Care Teams, Patient Goals)    Allergies (verified) Maxalt  [rizatriptan ] and Topamax  [topiramate ]   Current Medications (verified) Outpatient Encounter Medications as of 05/11/2024  Medication Sig   CALCIUM  PO Take by mouth.   cholecalciferol (VITAMIN D ) 1000 UNITS tablet Take 1,000 Units by mouth daily.   Coenzyme Q10 (CO Q 10 PO) Take by mouth.   Cyanocobalamin  (VITAMIN B 12 PO) Take by mouth.   cyclobenzaprine  (FLEXERIL ) 5 MG tablet Take 1 tablet (5 mg total) by mouth at bedtime as needed.   diclofenac  (VOLTAREN ) 25 MG EC tablet Take 1 tablet (25 mg total) by mouth 3 (three) times daily as needed.   diclofenac  Sodium (VOLTAREN ) 1 % GEL Apply 2 g topically 4 (  four) times daily.   EQ ALL DAY ALLERGY RELIEF 10 MG tablet  TAKE 1 TABLET BY MOUTH ONCE DAILY AS NEEDED FOR ALLERGIES (Patient taking differently: as needed.)   esomeprazole  (NEXIUM ) 40 MG capsule Take 1 capsule by mouth once daily   fluticasone  (FLONASE ) 50 MCG/ACT nasal spray Place 2 sprays into both nostrils daily. (Patient taking differently: Place 2 sprays into both nostrils as needed.)   levothyroxine  (SYNTHROID ) 75 MCG tablet Take 1 tablet (75 mcg total) by mouth daily before breakfast.   MELATONIN PO Take by mouth.   Multiple Vitamins-Minerals (MULTIVITAMIN & MINERAL PO) Take by mouth daily.   triamcinolone  cream (KENALOG ) 0.1 % SMARTSIG:1 Application Topical 2-3 Times Daily   valACYclovir  (VALTREX ) 500 MG tablet Take 1 tablet (500 mg total) by mouth daily. (Patient taking differently: Take 500 mg by mouth as needed.)   fluocinonide  cream (LIDEX ) 0.05 % Apply 1 Application topically as needed.   No facility-administered encounter medications on file as of 05/11/2024.    History: Past Medical History:  Diagnosis Date   Allergy    seasonal   Anxiety    Arthritis    knees,left hip   Blood transfusion without reported diagnosis    1983   Common migraine with intractable migraine 09/27/2020   Depression    GERD (gastroesophageal reflux disease)    Heart murmur    History of concussion 09/27/2020   06/2017   Thyroid  disease    hypothyroidism   Vitamin B12 deficiency    Past Surgical History:  Procedure Laterality Date   ABDOMINAL HYSTERECTOMY     DILATION AND CURETTAGE OF UTERUS     x3   KNEE SURGERY Right    ROOT CANAL  2025   Family History  Problem Relation Age of Onset   Diabetes Mother    Heart disease Mother    Diverticulosis Mother    Pancreatitis Mother    Asthma Mother    Heart disease Father    Arthritis Father    Seizures Brother    Kidney disease Brother    Breast cancer Cousin    Colon cancer Neg Hx    Colon polyps Neg Hx    Esophageal cancer Neg Hx    Rectal cancer Neg Hx    Stomach cancer Neg Hx     Social History   Occupational History   Occupation: retired  Tobacco Use   Smoking status: Former    Current packs/day: 0.00    Average packs/day: 0.5 packs/day for 15.0 years (7.5 ttl pk-yrs)    Types: Cigarettes    Start date: 87    Quit date: 1989    Years since quitting: 37.0    Passive exposure: Past   Smokeless tobacco: Never  Vaping Use   Vaping status: Never Used  Substance and Sexual Activity   Alcohol use: Yes    Alcohol/week: 1.0 standard drink of alcohol    Types: 1 Glasses of wine per week    Comment: 1-2  times per month   Drug use: Never   Sexual activity: Yes    Partners: Male   Tobacco Counseling Counseling given: Yes  SDOH Screenings   Food Insecurity: Food Insecurity Present (05/11/2024)  Housing: Unknown (05/11/2024)  Transportation Needs: No Transportation Needs (05/11/2024)  Utilities: Not At Risk (05/11/2024)  Alcohol Screen: Low Risk (12/31/2022)  Depression (PHQ2-9): Low Risk (05/11/2024)  Financial Resource Strain: Medium Risk (05/11/2024)  Physical Activity: Insufficiently Active (05/11/2024)  Social Connections: Moderately Isolated (05/11/2024)  Stress:  Stress Concern Present (05/11/2024)  Tobacco Use: Medium Risk (05/11/2024)  Health Literacy: Adequate Health Literacy (05/11/2024)   See flowsheets for full screening details  Depression Screen PHQ 2 & 9 Depression Scale- Over the past 2 weeks, how often have you been bothered by any of the following problems? Little interest or pleasure in doing things: 0 Feeling down, depressed, or hopeless (PHQ Adolescent also includes...irritable): 0 PHQ-2 Total Score: 0 Trouble falling or staying asleep, or sleeping too much: 0 Feeling tired or having little energy: 0 Poor appetite or overeating (PHQ Adolescent also includes...weight loss): 0 Feeling bad about yourself - or that you are a failure or have let yourself or your family down: 0 Trouble concentrating on things, such as reading the newspaper or  watching television (PHQ Adolescent also includes...like school work): 0 Moving or speaking so slowly that other people could have noticed. Or the opposite - being so fidgety or restless that you have been moving around a lot more than usual: 3 (uses a cane/walker) Thoughts that you would be better off dead, or of hurting yourself in some way: 0 PHQ-9 Total Score: 3 If you checked off any problems, how difficult have these problems made it for you to do your work, take care of things at home, or get along with other people?: Somewhat difficult  Depression Treatment Depression Interventions/Treatment : Medication; Currently on Treatment; PHQ2-9 Score <4 Follow-up Not Indicated     Goals Addressed               This Visit's Progress     Patient Stated (pt-stated)        Patient stated she needs to do more walking, drinking more water, and lose weight (almost 40lbs).             Objective:    Today's Vitals   05/11/24 1527  Weight: 182 lb (82.6 kg)  Height: 5' 2 (1.575 m)   Body mass index is 33.29 kg/m.  Hearing/Vision screen Hearing Screening - Comments:: Denies hearing difficulties   Vision Screening - Comments:: Wears rx glasses - up to date with routine eye exams with Dr LELON. Vannie Immunizations and Health Maintenance Health Maintenance  Topic Date Due   Zoster Vaccines- Shingrix  (2 of 2) 11/20/2021   Influenza Vaccine  11/22/2023   COVID-19 Vaccine (3 - 2025-26 season) 12/23/2023   Medicare Annual Wellness (AWV)  05/11/2025   Mammogram  11/03/2025   DTaP/Tdap/Td (2 - Tdap) 04/23/2026   Pneumococcal Vaccine: 50+ Years  Completed   Bone Density Scan  Completed   Hepatitis C Screening  Completed   Meningococcal B Vaccine  Aged Out   Fecal DNA (Cologuard)  Discontinued        Assessment/Plan:  This is a routine wellness examination for Nittany.  Colonoscopy status: Positive Cologuard in 2022 - needs a Colonoscopy & referral sent to [facility].  Patient  Care Team: Joshua Debby CROME, MD as PCP - General (Internal Medicine) Vannie Elsie HERO, OD (Ophthalmology)  I have personally reviewed and noted the following in the patients chart:   Medical and social history Use of alcohol, tobacco or illicit drugs  Current medications and supplements including opioid prescriptions. Functional ability and status Nutritional status Physical activity Advanced directives List of other physicians Hospitalizations, surgeries, and ER visits in previous 12 months Vitals Screenings to include cognitive, depression, and falls Referrals and appointments  Orders Placed This Encounter  Procedures   Ambulatory referral to Gastroenterology    Referral  Priority:   Routine    Referral Type:   Consultation    Referral Reason:   Specialty Services Required    Referred to Provider:   Stacia Glendia BRAVO, MD    Number of Visits Requested:   1   In addition, I have reviewed and discussed with patient certain preventive protocols, quality metrics, and best practice recommendations. A written personalized care plan for preventive services as well as general preventive health recommendations were provided to patient.   Verdie CHRISTELLA Saba, CMA   05/11/2024   Return in 1 year (on 05/11/2025).  After Visit Summary: (MyChart) Due to this being a telephonic visit, the after visit summary with patients personalized plan was offered to patient via MyChart   Nurse Notes: scheduled 2027 AWV appt "

## 2024-05-12 ENCOUNTER — Other Ambulatory Visit: Payer: Self-pay

## 2024-05-12 ENCOUNTER — Other Ambulatory Visit (HOSPITAL_COMMUNITY): Payer: Self-pay

## 2024-05-12 MED ORDER — FIDAXOMICIN 200 MG PO TABS
200.0000 mg | ORAL_TABLET | Freq: Two times a day (BID) | ORAL | 0 refills | Status: DC
  Filled 2024-05-12: qty 20, 10d supply, fill #0

## 2024-05-12 NOTE — Progress Notes (Signed)
 d

## 2024-05-12 NOTE — Progress Notes (Signed)
 Prescription sent in error, order cancelled.

## 2024-05-18 ENCOUNTER — Ambulatory Visit: Admitting: Internal Medicine

## 2024-05-25 ENCOUNTER — Other Ambulatory Visit: Payer: Self-pay | Admitting: Internal Medicine

## 2024-05-25 DIAGNOSIS — M170 Bilateral primary osteoarthritis of knee: Secondary | ICD-10-CM

## 2024-05-25 DIAGNOSIS — G8929 Other chronic pain: Secondary | ICD-10-CM

## 2024-05-25 DIAGNOSIS — M150 Primary generalized (osteo)arthritis: Secondary | ICD-10-CM

## 2024-06-15 ENCOUNTER — Ambulatory Visit: Admitting: Internal Medicine

## 2024-07-06 ENCOUNTER — Ambulatory Visit: Admitting: Physician Assistant

## 2024-08-28 ENCOUNTER — Ambulatory Visit: Admitting: Internal Medicine

## 2025-05-12 ENCOUNTER — Ambulatory Visit
